# Patient Record
Sex: Male | Born: 1965
Health system: Southern US, Community
[De-identification: ages and names within clinical notes are randomized; demographics above are authoritative.]

## PROBLEM LIST (undated history)

## (undated) DIAGNOSIS — J302 Other seasonal allergic rhinitis: Secondary | ICD-10-CM

## (undated) DIAGNOSIS — L409 Psoriasis, unspecified: Secondary | ICD-10-CM

## (undated) DIAGNOSIS — E785 Hyperlipidemia, unspecified: Secondary | ICD-10-CM

## (undated) DIAGNOSIS — K219 Gastro-esophageal reflux disease without esophagitis: Secondary | ICD-10-CM

## (undated) DIAGNOSIS — Z889 Allergy status to unspecified drugs, medicaments and biological substances status: Secondary | ICD-10-CM

## (undated) DIAGNOSIS — D235 Other benign neoplasm of skin of trunk: Secondary | ICD-10-CM

## (undated) DIAGNOSIS — G4733 Obstructive sleep apnea (adult) (pediatric): Secondary | ICD-10-CM

## (undated) DIAGNOSIS — K649 Unspecified hemorrhoids: Secondary | ICD-10-CM

## (undated) DIAGNOSIS — Z9989 Dependence on other enabling machines and devices: Secondary | ICD-10-CM

## (undated) DIAGNOSIS — E291 Testicular hypofunction: Secondary | ICD-10-CM

## (undated) HISTORY — PX: VASECTOMY: SHX75

## (undated) HISTORY — DX: Hyperlipidemia, unspecified: E78.5

## (undated) HISTORY — DX: Gastro-esophageal reflux disease without esophagitis: K21.9

## (undated) HISTORY — DX: Unspecified hemorrhoids: K64.9

## (undated) HISTORY — DX: Other seasonal allergic rhinitis: J30.2

## (undated) HISTORY — PX: KNEE SURGERY: SHX244

## (undated) HISTORY — DX: Dependence on other enabling machines and devices: Z99.89

## (undated) HISTORY — DX: Allergy status to unspecified drugs, medicaments and biological substances: Z88.9

## (undated) HISTORY — DX: Testicular hypofunction: E29.1

## (undated) HISTORY — PX: NASAL SINUS SURGERY: SHX719

## (undated) HISTORY — PX: TONSILLECTOMY: SUR1361

## (undated) HISTORY — DX: Psoriasis, unspecified: L40.9

## (undated) HISTORY — DX: Other benign neoplasm of skin of trunk: D23.5

## (undated) HISTORY — DX: Obstructive sleep apnea (adult) (pediatric): G47.33

---

## 1998-10-14 ENCOUNTER — Ambulatory Visit: Admission: RE | Admit: 1998-10-14 | Discharge: 1998-10-14 | Payer: Self-pay | Admitting: Internal Medicine

## 2001-05-24 ENCOUNTER — Ambulatory Visit (HOSPITAL_BASED_OUTPATIENT_CLINIC_OR_DEPARTMENT_OTHER): Admission: RE | Admit: 2001-05-24 | Discharge: 2001-05-24 | Payer: Self-pay | Admitting: Internal Medicine

## 2002-03-25 ENCOUNTER — Ambulatory Visit (HOSPITAL_COMMUNITY): Admission: RE | Admit: 2002-03-25 | Discharge: 2002-03-25 | Payer: Self-pay | Admitting: Family Medicine

## 2002-03-25 ENCOUNTER — Encounter: Payer: Self-pay | Admitting: Family Medicine

## 2005-07-18 ENCOUNTER — Ambulatory Visit (HOSPITAL_BASED_OUTPATIENT_CLINIC_OR_DEPARTMENT_OTHER): Admission: RE | Admit: 2005-07-18 | Discharge: 2005-07-18 | Payer: Self-pay | Admitting: Otolaryngology

## 2005-07-21 ENCOUNTER — Ambulatory Visit: Payer: Self-pay | Admitting: Internal Medicine

## 2005-08-19 ENCOUNTER — Encounter: Admission: RE | Admit: 2005-08-19 | Discharge: 2005-08-19 | Payer: Self-pay | Admitting: Occupational Medicine

## 2005-08-28 ENCOUNTER — Ambulatory Visit: Admission: RE | Admit: 2005-08-28 | Discharge: 2005-08-28 | Payer: Self-pay | Admitting: Otolaryngology

## 2005-10-16 ENCOUNTER — Inpatient Hospital Stay (HOSPITAL_COMMUNITY): Admission: RE | Admit: 2005-10-16 | Discharge: 2005-10-18 | Payer: Self-pay | Admitting: Otolaryngology

## 2005-10-16 ENCOUNTER — Encounter (INDEPENDENT_AMBULATORY_CARE_PROVIDER_SITE_OTHER): Payer: Self-pay | Admitting: *Deleted

## 2008-05-30 ENCOUNTER — Encounter: Payer: Self-pay | Admitting: Internal Medicine

## 2008-06-05 ENCOUNTER — Ambulatory Visit (HOSPITAL_BASED_OUTPATIENT_CLINIC_OR_DEPARTMENT_OTHER): Admission: RE | Admit: 2008-06-05 | Discharge: 2008-06-05 | Payer: Self-pay | Admitting: Otolaryngology

## 2008-06-10 ENCOUNTER — Ambulatory Visit: Payer: Self-pay | Admitting: Internal Medicine

## 2008-08-22 ENCOUNTER — Encounter: Payer: Self-pay | Admitting: Internal Medicine

## 2009-10-02 ENCOUNTER — Encounter
Admission: RE | Admit: 2009-10-02 | Discharge: 2009-10-02 | Payer: Self-pay | Admitting: Physical Medicine and Rehabilitation

## 2010-12-04 NOTE — Procedures (Signed)
NAMEDIERKS, WACH NO.:  [id number]   MEDICAL RECORD NO.:  [id number]          PATIENT TYPE:  OUT   LOCATION:  SLEEP CENTER                 FACILITY:  [facility]   PHYSICIAN:  Clinton D. Maple Hudson, MD, FCCP, FACPDATE OF BIRTH:  02-11-1967   DATE OF STUDY:  06/05/2008                            NOCTURNAL POLYSOMNOGRAM   REFERRING PHYSICIAN:   INDICATION FOR STUDY:  Hypersomnia with sleep apnea.   EPWORTH SLEEPINESS SCORE:  15/24.   BMI 30.8.  Weight 215 pounds.  Height 70 inches.  Neck 16.5 inches.  The  patient is status post UPPP.   MEDICATIONS:  Methotrexate and folic acid.   SLEEP ARCHITECTURE:  Split-study protocol:  During the diagnostic phase,  total sleep time was 145 minutes with sleep efficiency 79%.  Stage I was  3.4%.  Stage II 84.1%.  Stage III absent.  REM 12.4% of total sleep  time.  Sleep latency 24 minutes.  REM latency 121 minutes.  Wake after  sleep onset 5 minutes.  Arousal index 5.4.  No bedtime medication was  taken.   RESPIRATORY DATA:  Split-study protocol:  Apnea-hypopnea index (AHI) 29  per hour before CPAP.  A total of 70 events were scored including all  hypopneas.  Events were primarily associated with supine sleep position.  REM AHI 10.  CPAP was titrated to 13 CWP, AHI 0 hour.  He chose a  Respironics ComfortLite 2 size medium with heated humidifier.   OXYGEN DATA:  Moderately loud snoring before CPAP with oxygen  desaturation to a nadir of 87%.  Mean oxygen saturation through the  study after CPAP was 96.2% on room air.   CARDIAC DATA:  Normal sinus rhythm.   MOVEMENT-PARASOMNIA:  No significant movement disturbance.  No bathroom  trips.   IMPRESSIONS-RECOMMENDATIONS:  1. Moderate obstructive sleep apnea/hypopnea syndrome, AHI 29 per hour      with all of that being hypopneas, predominantly while sleeping      supine.  Moderately loud snoring with oxygen desaturation to a      nadir of 87%.  2. Successful CPAP titration to  13 CWP, AHI 0 per hour.  He chose a      Respironics ComfortLite 2 nasal mask with size medium heated      humidifier.      Clinton D. Maple Hudson, MD, [facility], FACP  Diplomate, Biomedical engineer of Sleep Medicine  Electronically Signed     CDY/MEDQ  D:  06/11/2008 13:57:58  T:  06/12/2008 16:10:96  Job:  [id number]

## 2010-12-07 NOTE — Op Note (Signed)
Lance Walker, Lance Walker NO.:  [id number]   MEDICAL RECORD NO.:  [id number]          PATIENT TYPE:  OIB   LOCATION:  3304                         FACILITY:  [facility]   PHYSICIAN:  Karol T. Lazarus Salines, M.D. DATE OF BIRTH:  01/28/1966   DATE OF PROCEDURE:  10/16/2005  DATE OF DISCHARGE:                                 OPERATIVE REPORT   PREOPERATIVE DIAGNOSIS:  Obstructive sleep apnea.  Nasal septal deviation.  Hypertrophic inferior turbinates.   POSTOPERATIVE DIAGNOSIS:  Obstructive sleep apnea.  Nasal septal deviation.  Hypertrophic inferior turbinates.   PROCEDURE PERFORMED:  Septoplasty, submucous resection of inferior  turbinates, tonsillectomy, uvulopalatopharyngoplasty.   SURGEON:  Gloris Manchester. Lazarus Salines, M.D.   ANESTHESIA:  General orotracheal.   BLOOD LOSS:  Minimal.   COMPLICATIONS:  None.   FINDINGS:  An overall narrow nose with a thick septum, spurred off the  maxillary crest on both sides and mildly deviated towards the left.  Hypertrophic inferior turbinates bilateral.  A relatively narrow fleshy  oropharynx with 1+ imbedded tonsils on a long thick soft palate and uvula.  Bulky tongue.   PROCEDURE:  With the patient in the comfortable supine position, general  orotracheal anesthesia was induced without difficulty.  At an appropriate  level, the patient was placed in a slight sitting position and the table was  turned 90 degrees away from anesthesia.  A saline moistened throat pack was  placed.  Nasal vibrissae were trimmed.  Cocaine crystals, 200 mg total were  placed on cotton carriers into the anterior ethmoid and sphenopalatine  ganglion regions on both sides.  Cocaine solution, 160 mg total were applied  on 1/2  x 3 inches cotton pledgets against the septum on both sides.  1%  Xylocaine with 1:100,000 epinephrine, 8 mL total infiltrated into the  submucoperichondrial plane of the septum on both sides, into the anterior  floor of the nose on both  sides, and around the nasal spine.  Several  minutes were allowed for this take effect.  A sterile preparation and  draping of the midface was accomplished.   The materials were removed from the nose and observed to be intact and  correct in number.  The findings were as described above.  A small left  floor incision was executed and a right hemitransfixion incision was  executed, carried to the caudal edge of the quadrangular cartilage, and  continued onto the floor.  Floor tunnels were dissected on both sides and  brought up to the maxillary crest anteriorly and to the vomer posteriorly.   The submucoperichondrial plane of the right septum was elevated up to the  dorsum of the nose back onto the perpendicular plate and brought down and  communicated with the floor tunnels.  There were several small linear rents  on the right side.  The chondroethmoid junction was identified and opened  with the Cottle elevator and the opposite submucoperiosteal plane was  dissected.  The superior perpendicular plate was lysed with an open Leane Para-  Middleton forceps.  The bone was rather thick.  Upon separating the  perpendicular plate from the  vault of the nose, the inferior portion was  dissected and then rocked free and delivered.  Additional bony and  cartilaginous spicules were dissected and delivered.  At this point a 2 mm  strip of the inferior quadrangular cartilage was incised and dissected  submucosally.  Dissection was carried down to the left floor tunnel allowing  the septum to mobilize in the midline.  The posterior maxillary crest behind  the caudal strut was resected using mallet and osteotome.  The anterior  portion was narrow but not reduced in its vertical height.  The quadrangular  cartilage at this point could trapdoor directly into the midline and was  secured to the maxillary crest with a figure-of-eight 4-0 PDS suture.  Hemostasis was observed.  The septal tunnels were suctioned  clear.  The  flaps were laid back down and the wounds were closed with interrupted 4-0  chromic suture.   Just prior to completing the septoplasty, the inferior turbinates were  infiltrated with 4 mL total of 1% Xylocaine with 1:100,000 epinephrine.   Beginning on the right side, the anterior hood of the inferior turbinate was  incised just behind the nasal valve.  The medial mucosa of the turbinate was  incised in an anterior upsloping fashion.  A laterally based flap was  developed.  The turbinate was infractured.  Using angled turbinate scissors,  turbinate bone and lateral mucosa was resected in a posterior downsloping  fashion taking essentially all of the anterior pole and leaving all of the  posterior pole.  Bony spicules were removed to allow more prompt healing.  The bulbous posterior pole and the cut mucosal edges were suction coagulated  for hemostasis.  The flap was laid back down, the turbinate was  outfractured, and this side was completed.  The left side was done in  identical fashion.   After completing the septoplasty in the turbinates, 0.040 reinforced  Silastic splints were fashioned and placed against the septum on both sides  and secured thereto with a 3-0 nylon stitch.  A double thickness Neosporin  impregnated Telfa pack was placed in both sides of the nose against the  inferior turbinates.  A 6.5-mm nasal trumpet which had been shortened to  reach the nasopharynx was passed between the Silastic and the Telfa packing  on both sides to allow a modest postoperative airway.  This completed the  nasal portion of the procedure.  Hemostasis was observed.   At this point the patient was flattened and placed in Trendelenburg.  The  pharynx was suctioned clean the throat pack was removed.  Taking care to  protect lips, teeth, and endotracheal tube, the Crowe-Davis mouth gag was  introduced, expanded for visualization, and suspended from the Mayo stand in the standard  fashion.  The findings were as described above.  The tongue was  quite bulky and both tonsils could not be directly accessed at the very same  time.  The previously placed [place] ink tattoo mark was noted.   The V chevron was placed around the tattoo mark up onto the soft palate.  From the top of the V, the lateral limbs were extended beginning with the  cautery over onto the anterior tonsillar pillars.  The mouth gag was  repositioned for access to the left tonsil.  The tonsil was grasped and  retracted medially.  Using cautery, the anterior pillar was dissected down  to the capsule of the tonsil which was quite fibrotic.  The tonsil was  dissected  from its muscular fossa and the dissection was carried up into a  broad arch onto the soft palate full-thickness.  It remained pedicled by the  central soft palate.   The mouth gag was repositioned and the right tonsil was approached in the  same fashion.   After having both tonsils dissected and pedicled by the central soft palate,  the dissection was carried out taking pains to preserve some posterior  mucosa in the midline to bring forward into the chevron.   The specimen was resected en bloc with both tonsils and the soft palate.  This was sent for gross only interpretation by pathology.  Hemostasis was  observed.   The posterior mucosa of the uvula was brought forward into the chevron and  secured thereto with a 3-0 Vicryl stitch.  The mucosa was brought forward  and directed laterally across the free edge of the soft palate.  The  nasopharyngeal mucosa of the soft palate was incised in the lateral  direction to allow the posterior tonsillar pillar to be brought directly  laterally to the anterior tonsillar pillar without tethering the corner of  the soft palate.  After this incision, the tonsil fossae were closed on each  side with 3-0 Vicryl stitches and additional stitches were placed along the  free edge of the soft palate  bringing the nasopharyngeal mucosa forward.  A  good configuration of the soft palate was accomplished.  Hemostasis was  observed.  The pharynx was irrigated and suctioned clean.  The mouth gag was  relaxed for several minutes.   Upon re-expansion, hemostasis was observed in the mouth and in the throat.  At this point the procedure was completed.  The patient was returned to  anesthesia, awakened, extubated, and transferred to recovery in stable  condition.   COMMENT:  45 year old white male with moderate sleep apnea with  contributions from the narrow nose, deviated septum, hypertrophic  turbinates, and a long thick soft palate and uvula were the indications for  the several components of today's procedure.  Anticipate routine  postoperative recovery with attention to ice, elevation, analgesia,  antibiosis, hydration, and observation for bleeding, emesis, or airway  compromise.  Will observe him in the step-down unit given the combination of general anesthesia, narcotic analgesics, and his sleep apnea.      Gloris Manchester. Lazarus Salines, M.D.  Electronically Signed     KTW/MEDQ  D:  10/16/2005  T:  10/17/2005  Job:  [id number]   cc:   Holley Bouche, M.D.  Fax: [phone]   Clinton D. Maple Hudson, M.D.  [facility]  [address]  [place]  [place] [address]

## 2010-12-07 NOTE — Procedures (Signed)
NAME:  Lance Walker, Lance Walker NO.:  [id number]   MEDICAL RECORD NO.:  [id number]          PATIENT TYPE:  OUT   LOCATION:  SLEEP CENTER                 FACILITY:  [facility]   PHYSICIAN:  Clinton D. Maple Hudson, M.D. DATE OF BIRTH:  Dec 10, 1965   DATE OF STUDY:                              NOCTURNAL POLYSOMNOGRAM   REFERRING PHYSICIAN:  Dr. Flo Shanks.   DATE OF STUDY:  July 18, 2005.   INDICATION FOR STUDY:  Hypersomnia with sleep apnea.   EPWORTH SLEEPINESS SCORE:  13/24.   BMI:  30.   WEIGHT:  210 pounds.   No home medications reported.   SLEEP ARCHITECTURE:  Total sleep time 415 minutes with sleep efficiency 90%.  Stage I 5%, stage II 75%, stages III and IV 1%, REM 19% of total sleep time.  Sleep latency 17 minutes, REM latency 86 minutes, awake after sleep onset 31  minutes, arousal index 68 which is increased. No bedtime medication.   RESPIRATORY DATA:  NPSG protocol:  Apnea/hypopnea index (AHI, RDI) 19.9  obstructive events per hour indicating moderate obstructive sleep  apnea/hypopnea syndrome. There were 67 obstructive apneas and 71 hypopneas.  Events were positional, mostly reported while sleeping supine (AHI 40.4 per  hour).   OXYGEN DATA:  Moderate snoring with oxygen desaturation to a nadir of 74%.  Mean oxygen saturation through the study was 95% on room air.   CARDIAC DATA:  Sinus rhythm with sinus bradycardia 54-57 beats per minute.   MOVEMENT/PARASOMNIA:  Occasional leg jerk, insignificant.   IMPRESSION/RECOMMENDATIONS:  1.  Moderate obstructive sleep apnea/hypopnea syndrome, AHI 19.9 per hour      with positional events worst while      supine (AHI 40.4 per hour), moderate snoring and oxygen desaturation to      74%.  2.  NPSG protocol was ordered. C-PAP titration can be done if appropriate.      Clinton D. Maple Hudson, M.D.  Diplomate, Biomedical engineer of Sleep Medicine  Electronically Signed     CDY/MEDQ  D:  07/21/2005 10:52:41  T:   07/21/2005 16:49:40  Job:  [id number]

## 2011-01-15 ENCOUNTER — Other Ambulatory Visit (HOSPITAL_COMMUNITY): Payer: Self-pay | Admitting: Dermatology

## 2011-01-15 DIAGNOSIS — L408 Other psoriasis: Secondary | ICD-10-CM

## 2011-01-29 ENCOUNTER — Other Ambulatory Visit (HOSPITAL_COMMUNITY): Payer: Self-pay

## 2011-02-05 ENCOUNTER — Ambulatory Visit (HOSPITAL_COMMUNITY)
Admission: RE | Admit: 2011-02-05 | Discharge: 2011-02-05 | Disposition: A | Discharge status: Home / Self Care | Payer: 59 | Source: Ambulatory Visit | Attending: Dermatology | Admitting: Dermatology

## 2011-02-05 ENCOUNTER — Other Ambulatory Visit: Payer: Self-pay | Admitting: Interventional Radiology

## 2011-02-05 DIAGNOSIS — L408 Other psoriasis: Secondary | ICD-10-CM | POA: Insufficient documentation

## 2011-02-05 DIAGNOSIS — Z79899 Other long term (current) drug therapy: Secondary | ICD-10-CM | POA: Insufficient documentation

## 2011-02-05 LAB — CBC
HCT: 43.3 % (ref 39.0–52.0)
MCH: 31.1 pg (ref 26.0–34.0)
MCHC: 35.3 g/dL (ref 30.0–36.0)
MCV: 88 fL (ref 78.0–100.0)
RDW: 13.2 % (ref 11.5–15.5)

## 2012-04-20 ENCOUNTER — Other Ambulatory Visit: Payer: Self-pay | Admitting: Family Medicine

## 2012-04-22 ENCOUNTER — Ambulatory Visit
Admission: RE | Admit: 2012-04-22 | Discharge: 2012-04-22 | Disposition: A | Discharge status: Home / Self Care | Payer: Self-pay | Source: Ambulatory Visit | Attending: Family Medicine | Admitting: Family Medicine

## 2014-03-29 ENCOUNTER — Other Ambulatory Visit: Payer: Self-pay | Admitting: Family Medicine

## 2014-03-29 ENCOUNTER — Ambulatory Visit
Admission: RE | Admit: 2014-03-29 | Discharge: 2014-03-29 | Disposition: A | Discharge status: Home / Self Care | Payer: BC Managed Care – PPO | Source: Ambulatory Visit | Attending: Family Medicine | Admitting: Family Medicine

## 2014-03-29 DIAGNOSIS — M546 Pain in thoracic spine: Secondary | ICD-10-CM

## 2014-08-26 ENCOUNTER — Other Ambulatory Visit: Payer: Self-pay | Admitting: *Deleted

## 2014-08-26 DIAGNOSIS — R000 Tachycardia, unspecified: Secondary | ICD-10-CM

## 2014-08-29 ENCOUNTER — Encounter (INDEPENDENT_AMBULATORY_CARE_PROVIDER_SITE_OTHER): Payer: BLUE CROSS/BLUE SHIELD

## 2014-08-29 ENCOUNTER — Encounter: Payer: Self-pay | Admitting: *Deleted

## 2014-08-29 DIAGNOSIS — R000 Tachycardia, unspecified: Secondary | ICD-10-CM

## 2014-08-29 NOTE — Progress Notes (Signed)
Patient ID: Lance Walker, male   DOB: [birth year], 49 y.o.   MRN: [id number] Preventice verite 30 day cardiac event monitor applied to patient.

## 2014-09-01 ENCOUNTER — Telehealth: Payer: Self-pay | Admitting: *Deleted

## 2014-09-01 NOTE — Telephone Encounter (Signed)
Spoke with patient.  Pt confirmed that he was at the gym during the time of monitor tracing yesterday.  Pt states he is fine now.  Pt states he regularly goes to the gym M-Tu-Wed-Thur 4:30-6PM.  Reviewed with Dr Rayann Heman.

## 2014-09-01 NOTE — Telephone Encounter (Signed)
Received monitor tracing dated 08/31/14 4:26 PM (CST):  Sinus tachycardia w/Lead Loss/Artifact HR 160.2 Patient symptom: Auto Detect: Asymptomatic    Patient Activity: Exercising    Location: Gym  LMTCB for pt to confirm he was exercising at this time.

## 2014-09-01 NOTE — Telephone Encounter (Signed)
Follow Up  Pt returning Anne's call. Please call back and discuss.

## 2014-09-09 ENCOUNTER — Telehealth: Payer: Self-pay

## 2014-09-09 NOTE — Telephone Encounter (Signed)
Pt wearing heart monitor on day 11 (2/18) at 4:16pm he had event of Sinus Tachy with HR of 162.  Pt was contacted and he was on the way to the gym driving in his car.  He denise any c/o of dizziness, CP, palpations, or SOB.  Pt also stated that he is being mailed a new monitor as his is stuck on "passed out" for when he pushes the button. Pt currently denies any CP or palpitations and knows to call our office or 911 is starts to have symptoms.

## 2016-08-22 DIAGNOSIS — D235 Other benign neoplasm of skin of trunk: Secondary | ICD-10-CM

## 2016-08-22 HISTORY — PX: SHOULDER SURGERY: SHX246

## 2016-08-22 HISTORY — DX: Other benign neoplasm of skin of trunk: D23.5

## 2016-11-19 DIAGNOSIS — M25511 Pain in right shoulder: Secondary | ICD-10-CM | POA: Diagnosis not present

## 2016-11-19 DIAGNOSIS — M25512 Pain in left shoulder: Secondary | ICD-10-CM | POA: Diagnosis not present

## 2016-11-25 DIAGNOSIS — M5412 Radiculopathy, cervical region: Secondary | ICD-10-CM | POA: Diagnosis not present

## 2016-12-02 DIAGNOSIS — M150 Primary generalized (osteo)arthritis: Secondary | ICD-10-CM | POA: Diagnosis not present

## 2016-12-02 DIAGNOSIS — M25511 Pain in right shoulder: Secondary | ICD-10-CM | POA: Diagnosis not present

## 2016-12-02 DIAGNOSIS — M25512 Pain in left shoulder: Secondary | ICD-10-CM | POA: Diagnosis not present

## 2016-12-05 DIAGNOSIS — L400 Psoriasis vulgaris: Secondary | ICD-10-CM | POA: Diagnosis not present

## 2016-12-05 DIAGNOSIS — D485 Neoplasm of uncertain behavior of skin: Secondary | ICD-10-CM | POA: Diagnosis not present

## 2016-12-05 DIAGNOSIS — D225 Melanocytic nevi of trunk: Secondary | ICD-10-CM | POA: Diagnosis not present

## 2016-12-05 DIAGNOSIS — L814 Other melanin hyperpigmentation: Secondary | ICD-10-CM | POA: Diagnosis not present

## 2016-12-05 DIAGNOSIS — L718 Other rosacea: Secondary | ICD-10-CM | POA: Diagnosis not present

## 2016-12-06 DIAGNOSIS — H11002 Unspecified pterygium of left eye: Secondary | ICD-10-CM | POA: Diagnosis not present

## 2016-12-13 DIAGNOSIS — E291 Testicular hypofunction: Secondary | ICD-10-CM | POA: Diagnosis not present

## 2016-12-24 DIAGNOSIS — E291 Testicular hypofunction: Secondary | ICD-10-CM | POA: Diagnosis not present

## 2017-01-02 DIAGNOSIS — R945 Abnormal results of liver function studies: Secondary | ICD-10-CM | POA: Diagnosis not present

## 2017-02-17 DIAGNOSIS — H524 Presbyopia: Secondary | ICD-10-CM | POA: Diagnosis not present

## 2017-02-17 DIAGNOSIS — H11002 Unspecified pterygium of left eye: Secondary | ICD-10-CM | POA: Diagnosis not present

## 2017-02-17 DIAGNOSIS — Z1211 Encounter for screening for malignant neoplasm of colon: Secondary | ICD-10-CM | POA: Diagnosis not present

## 2017-02-26 DIAGNOSIS — Z1211 Encounter for screening for malignant neoplasm of colon: Secondary | ICD-10-CM | POA: Diagnosis not present

## 2017-02-26 DIAGNOSIS — D126 Benign neoplasm of colon, unspecified: Secondary | ICD-10-CM | POA: Diagnosis not present

## 2017-03-11 DIAGNOSIS — M25511 Pain in right shoulder: Secondary | ICD-10-CM | POA: Diagnosis not present

## 2017-04-03 DIAGNOSIS — G4733 Obstructive sleep apnea (adult) (pediatric): Secondary | ICD-10-CM | POA: Diagnosis not present

## 2017-04-15 DIAGNOSIS — M25511 Pain in right shoulder: Secondary | ICD-10-CM | POA: Diagnosis not present

## 2017-04-25 DIAGNOSIS — G4733 Obstructive sleep apnea (adult) (pediatric): Secondary | ICD-10-CM | POA: Diagnosis not present

## 2017-05-14 DIAGNOSIS — Z23 Encounter for immunization: Secondary | ICD-10-CM | POA: Diagnosis not present

## 2017-05-14 DIAGNOSIS — Z125 Encounter for screening for malignant neoplasm of prostate: Secondary | ICD-10-CM | POA: Diagnosis not present

## 2017-05-14 DIAGNOSIS — G4730 Sleep apnea, unspecified: Secondary | ICD-10-CM | POA: Diagnosis not present

## 2017-05-14 DIAGNOSIS — E7800 Pure hypercholesterolemia, unspecified: Secondary | ICD-10-CM | POA: Diagnosis not present

## 2017-05-14 DIAGNOSIS — Z0000 Encounter for general adult medical examination without abnormal findings: Secondary | ICD-10-CM | POA: Diagnosis not present

## 2017-05-20 ENCOUNTER — Other Ambulatory Visit: Payer: Self-pay | Admitting: Family Medicine

## 2017-05-20 DIAGNOSIS — N5089 Other specified disorders of the male genital organs: Secondary | ICD-10-CM

## 2017-05-26 ENCOUNTER — Ambulatory Visit
Admission: RE | Admit: 2017-05-26 | Discharge: 2017-05-26 | Disposition: A | Discharge status: Home / Self Care | Payer: Commercial Managed Care - PPO | Source: Ambulatory Visit | Attending: Family Medicine | Admitting: Family Medicine

## 2017-05-26 DIAGNOSIS — N5089 Other specified disorders of the male genital organs: Secondary | ICD-10-CM

## 2017-05-26 DIAGNOSIS — N433 Hydrocele, unspecified: Secondary | ICD-10-CM | POA: Diagnosis not present

## 2017-06-09 ENCOUNTER — Ambulatory Visit (INDEPENDENT_AMBULATORY_CARE_PROVIDER_SITE_OTHER): Payer: Commercial Managed Care - PPO | Admitting: Cardiology

## 2017-06-09 ENCOUNTER — Encounter: Payer: Self-pay | Admitting: Cardiology

## 2017-06-09 VITALS — BP 130/89 | HR 73 | Ht 70.0 in | Wt 216.0 lb

## 2017-06-09 DIAGNOSIS — R030 Elevated blood-pressure reading, without diagnosis of hypertension: Secondary | ICD-10-CM | POA: Diagnosis not present

## 2017-06-09 DIAGNOSIS — E669 Obesity, unspecified: Secondary | ICD-10-CM | POA: Diagnosis not present

## 2017-06-09 DIAGNOSIS — E785 Hyperlipidemia, unspecified: Secondary | ICD-10-CM | POA: Insufficient documentation

## 2017-06-09 DIAGNOSIS — Z9189 Other specified personal risk factors, not elsewhere classified: Secondary | ICD-10-CM

## 2017-06-09 NOTE — Progress Notes (Signed)
PCP: Shirline Frees, MD  Clinic Note: Chief Complaint  Patient presents with  . New Patient (Initial Visit)    was told to come here for a baseline now that he is 51     HPI: Lance Walker is a 51 y.o. male who is being seen today for Baseline CV Risk Evaluation of at the request of Shirline Frees, MD.  [name] was seen by his PCP on May 14, 2017 for routine follow-up and requested based on cardiovascular evaluation.  Apparently he had a event monitor back in March 2016 -he cannot really remember what it was for. He has OSA for which she uses CPAP.  He also is concerned about possibly having narcolepsy.  He says it does not really help his daytime sleepiness.  Recent Hospitalizations: None  Studies Personally Reviewed - (if available, images/films reviewed: From Epic Chart or Care Everywhere)  None  Interval History: Yuepheng is a otherwise healthy gentleman with cholesterolemia and CPAP with mild obesity and borderline hypertension who presents here today to establish a cardiology baseline.  He is relatively active, and does CrossFit training for about 45 minutes to an hour at least once a week if not twice.  Depends on the timing.  He is a former smoker who quit in 1995.  He notes that because of his grandfather's history of heart disease he wanted to be evaluated. With his CrossFit training, he denies chest tightness or pressure with rest or exertion.  No heart failure symptoms of PND, orthopnea or edema.  No rapid irregular heartbeats or palpitations.  No syncope/near syncope or TIA/amaurosis fugax symptoms.   No claudication  ROS: A comprehensive was performed. Review of Systems  Constitutional: Positive for malaise/fatigue.  HENT: Negative for congestion and nosebleeds.   Respiratory: Positive for cough (Sometimes in the morning with allergies).   Cardiovascular: Negative.   Gastrointestinal: Negative for blood in stool, constipation and melena.    Genitourinary: Negative for hematuria.  Musculoskeletal: Negative for joint pain (Still has some issues with his right shoulder after his injury.).  Neurological: Negative for dizziness and focal weakness.       Significant daytime sleepiness fatigue.  This is despite using CPAP.  Endo/Heme/Allergies: Negative for environmental allergies.  Psychiatric/Behavioral: Negative for memory loss. The patient has insomnia. The patient is not nervous/anxious.   All other systems reviewed and are negative.  I have reviewed and (if needed) personally updated the patient's problem list, medications, allergies, past medical and surgical history, social and family history.   Past Medical History:  Diagnosis Date  . Diet-controlled hyperlipidemia   . Dysplastic nevus of trunk 08/2016   Mid back  . GERD (gastroesophageal reflux disease)   . H/O seasonal allergies   . Hemorrhoids    With related constipation  . Hypogonadism in male   . OSA on CPAP    Concerns for possible narcolepsy  . Psoriasis    Followed by Dr. Ronnald Ramp    Past Surgical History:  Procedure Laterality Date  . KNEE SURGERY    . NASAL SINUS SURGERY     Uvulopalatopharyngoplasty  . SHOULDER SURGERY Left 08/2016  . TONSILLECTOMY    . VASECTOMY      Current Meds  Medication Sig  . ASPIRIN 81 PO Take 81 mg daily by mouth.  . cetirizine (ZYRTEC) 10 MG tablet Take 10 mg daily by mouth.  . clomiPHENE (CLOMID) 50 MG tablet Take 0.5 tablets daily by mouth.    No Known  Allergies  Social History   Socioeconomic History  . Marital status: Married    Spouse name: None  . Number of children: None  . Years of education: 54  . Highest education level: Some college, no degree  Social Needs  . Financial resource strain: None  . Food insecurity - worry: None  . Food insecurity - inability: None  . Transportation needs - medical: None  . Transportation needs - non-medical: None  Occupational History  . Occupation: Programme researcher, broadcasting/film/video: [employer]  Tobacco Use  . Smoking status: Former Smoker    Types: Cigarettes    Last attempt to quit: 1995    Years since quitting: 23.9  . Smokeless tobacco: Never Used  . Tobacco comment: quit 1995  Substance and Sexual Activity  . Alcohol use: Yes    Alcohol/week: 2.4 oz    Types: 4 Cans of beer per week    Comment: 3-5 drinks of beer or liquor  . Drug use: No  . Sexual activity: None  Other Topics Concern  . None  Social History Narrative   Married father of 1 daughter.  He lives with his wife and daughter.      family history includes Alcohol abuse in his father; Breast cancer in his mother; Heart disease in his maternal grandfather; Hypertension in his maternal grandfather.  Wt Readings from Last 3 Encounters:  06/09/17 216 lb (98 kg)    PHYSICAL EXAM BP 130/89 (BP Location: Left Arm)   Pulse 73   Ht 5\' 10"  (1.778 m)   Wt 216 lb (98 kg)   BMI 30.99 kg/m  Physical Exam  Constitutional: He is oriented to person, place, and time. He appears well-developed and well-nourished. No distress.  Borderline obese  HENT:  Head: Normocephalic and atraumatic.  Eyes: Conjunctivae and EOM are normal. Pupils are equal, round, and reactive to light.  Neck: Normal range of motion. Neck supple. No hepatojugular reflux and no JVD present. Carotid bruit is not present. No thyromegaly present.  Cardiovascular: Normal rate, regular rhythm and intact distal pulses.  No extrasystoles are present. PMI is not displaced. Exam reveals no gallop and no friction rub.  No murmur heard. Nondisplaced   Pulmonary/Chest: Effort normal and breath sounds normal. No respiratory distress. He has no wheezes. He has no rales.  Abdominal: Soft. Bowel sounds are normal. He exhibits no distension. There is no tenderness. There is no rebound.  Musculoskeletal: Normal range of motion. He exhibits no edema or deformity.  Neurological: He is alert and oriented to person, place, and  time. No cranial nerve deficit.  Skin: Skin is warm and dry. No rash noted. No erythema.  Psychiatric: He has a normal mood and affect. His behavior is normal. Judgment and thought content normal.  Nursing note and vitals reviewed.    Adult ECG Report  Rate: 64 ;  Rhythm: normal sinus rhythm and Normal axis, intervals and durations; normal voltage  Narrative Interpretation: Normal EKG  Other studies Reviewed: Additional studies/ records that were reviewed today include:  Recent Labs: 05/14/2017  Na+ 143, K+ 4.4, Cl- 102, HCO3- 31, BUN 21, Cr 1.3, Glu 79, Ca2+ 10.2; AST 70, ALT 103, AlkP 61,  T Bili 0.9, TP 7.3,Alb 4.8  CBC: W 5.1, H/H 16/49, Plt 203  TC 244, TG 144, HDL 44, LDL 172    ASSESSMENT / PLAN: Problem List Items Addressed This Visit    Borderline hypertension - Primary    Blood pressure  looks borderline today with a diastolic pressure of 89.  Will need to follow.      Relevant Orders   EKG 12-Lead (Completed)   CT CARDIAC SCORING   Cardiovascular risk factor    51 year old gentleman with mild obesity, hyperlipidemia and borderline blood pressures is borderline for metabolic syndrome. For requests we will check her cardiovascular risk.  I think a stress test in this gentleman who does CrossFit training is probably not going to be helpful as it would likely be negative.  I think the best option will be to exclude existing coronary disease with a coronary calcium score.  Plan: Coronary calcium score      Hyperlipidemia with target LDL less than 130 (Chronic)    Levels are somewhat elevated..  We can get some baseline cardiac risk evaluation to determine if we need to be more aggressive as his current LDL is 170.  Plan: Check Coronary Calcium Score CT for risk stratification.      Relevant Medications   ASPIRIN 81 PO   Other Relevant Orders   EKG 12-Lead (Completed)   CT CARDIAC SCORING   Obesity (BMI 30.0-34.9)    He does exercise, but needs to be more  regular. The patient understands the need to lose weight with diet and exercise. We have discussed specific strategies for this.      Relevant Orders   EKG 12-Lead (Completed)   CT CARDIAC SCORING      Current medicines are reviewed at length with the patient today. (+/- concerns) none The following changes have been made:None  Patient Instructions  Your physician recommends that you schedule a follow-up appointment in [place].    Dr Ellyn Hack has ordered a CT coronary calcium score. This test is done at [address]. THERE IS AN $150 CHARGE THAIS NOT COVERED BY INSURANCE   Coronary CalciumScan A coronary calcium scan is an imaging test used to look for deposits of calcium and other fatty materials (plaques) in the inner lining of the blood vessels of the heart (coronary arteries). These deposits of calcium and plaques can partly clog and narrow the coronary arteries without producing any symptoms or warning signs. This puts a person at risk for a heart attack. This test can detect these deposits before symptoms develop. Tell a health care provider about:  Any allergies you have.  All medicines you are taking, including vitamins, herbs, eye drops, creams, and over-the-counter medicines.  Any problems you or family members have had with anesthetic medicines.  Any blood disorders you have.  Any surgeries you have had.  Any medical conditions you have.  Whether you are pregnant or may be pregnant. What are the risks? Generally, this is a safe procedure. However, problems may occur, including:  Harm to a pregnant woman and her unborn baby. This test involves the use of radiation. Radiation exposure can be dangerous to a pregnant woman and her unborn baby. If you are pregnant, you generally should not have this procedure done.  Slight increase in the risk of cancer. This is because of the radiation involved in the test. What happens before  the procedure? No preparation is needed for this procedure. What happens during the procedure?  You will undress and remove any jewelry around your neck or chest.  You will put on a hospital gown.  Sticky electrodes will be placed on your chest. The electrodes will be connected to an electrocardiogram (ECG) machine to record a  tracing of the electrical activity of your heart.  A CT scanner will take pictures of your heart. During this time, you will be asked to lie still and hold your breath for 2-3 seconds while a picture of your heart is being taken. The procedure may vary among health care providers and hospitals. What happens after the procedure?  You can get dressed.  You can return to your normal activities.  It is up to you to get the results of your test. Ask your health care provider, or the department that is doing the test, when your results will be ready. Summary  A coronary calcium scan is an imaging test used to look for deposits of calcium and other fatty materials (plaques) in the inner lining of the blood vessels of the heart (coronary arteries).  Generally, this is a safe procedure. Tell your health care provider if you are pregnant or may be pregnant.  No preparation is needed for this procedure.  A CT scanner will take pictures of your heart.  You can return to your normal activities after the scan is done. This information is not intended to replace advice given to you by your health care provider. Make sure you discuss any questions you have with your health care provider. Document Released: 01/04/2008 Document Revised: 05/27/2016 Document Reviewed: 05/27/2016 Elsevier Interactive Patient Education  2017 [employer].    Studies Ordered:   Orders Placed This Encounter  Procedures  . CT CARDIAC SCORING  . EKG 12-Lead      Glenetta Hew, M.D., M.S. Interventional Cardiologist   Pager # [phone] Phone # [phone] [address]. [place], [place] [address]

## 2017-06-09 NOTE — Patient Instructions (Addendum)
Your physician recommends that you schedule a follow-up appointment in [place].    Dr Ellyn Hack has ordered a CT coronary calcium score. This test is done at [address]. THERE IS AN $150 CHARGE THAIS NOT COVERED BY INSURANCE   Coronary CalciumScan A coronary calcium scan is an imaging test used to look for deposits of calcium and other fatty materials (plaques) in the inner lining of the blood vessels of the heart (coronary arteries). These deposits of calcium and plaques can partly clog and narrow the coronary arteries without producing any symptoms or warning signs. This puts a person at risk for a heart attack. This test can detect these deposits before symptoms develop. Tell a health care provider about:  Any allergies you have.  All medicines you are taking, including vitamins, herbs, eye drops, creams, and over-the-counter medicines.  Any problems you or family members have had with anesthetic medicines.  Any blood disorders you have.  Any surgeries you have had.  Any medical conditions you have.  Whether you are pregnant or may be pregnant. What are the risks? Generally, this is a safe procedure. However, problems may occur, including:  Harm to a pregnant woman and her unborn baby. This test involves the use of radiation. Radiation exposure can be dangerous to a pregnant woman and her unborn baby. If you are pregnant, you generally should not have this procedure done.  Slight increase in the risk of cancer. This is because of the radiation involved in the test. What happens before the procedure? No preparation is needed for this procedure. What happens during the procedure?  You will undress and remove any jewelry around your neck or chest.  You will put on a hospital gown.  Sticky electrodes will be placed on your chest. The electrodes will be connected to an electrocardiogram (ECG) machine to record a tracing of the electrical  activity of your heart.  A CT scanner will take pictures of your heart. During this time, you will be asked to lie still and hold your breath for 2-3 seconds while a picture of your heart is being taken. The procedure may vary among health care providers and hospitals. What happens after the procedure?  You can get dressed.  You can return to your normal activities.  It is up to you to get the results of your test. Ask your health care provider, or the department that is doing the test, when your results will be ready. Summary  A coronary calcium scan is an imaging test used to look for deposits of calcium and other fatty materials (plaques) in the inner lining of the blood vessels of the heart (coronary arteries).  Generally, this is a safe procedure. Tell your health care provider if you are pregnant or may be pregnant.  No preparation is needed for this procedure.  A CT scanner will take pictures of your heart.  You can return to your normal activities after the scan is done. This information is not intended to replace advice given to you by your health care provider. Make sure you discuss any questions you have with your health care provider. Document Released: 01/04/2008 Document Revised: 05/27/2016 Document Reviewed: 05/27/2016 Elsevier Interactive Patient Education  2017 [employer].

## 2017-06-12 ENCOUNTER — Encounter: Payer: Self-pay | Admitting: Cardiology

## 2017-06-12 DIAGNOSIS — R030 Elevated blood-pressure reading, without diagnosis of hypertension: Secondary | ICD-10-CM | POA: Insufficient documentation

## 2017-06-12 DIAGNOSIS — Z9189 Other specified personal risk factors, not elsewhere classified: Secondary | ICD-10-CM | POA: Insufficient documentation

## 2017-06-12 DIAGNOSIS — E669 Obesity, unspecified: Secondary | ICD-10-CM | POA: Insufficient documentation

## 2017-06-12 NOTE — Assessment & Plan Note (Addendum)
51 year old gentleman with mild obesity, hyperlipidemia and borderline blood pressures is borderline for metabolic syndrome. For requests we will check her cardiovascular risk.  I think a stress test in this gentleman who does CrossFit training is probably not going to be helpful as it would likely be negative.  I think the best option will be to exclude existing coronary disease with a coronary calcium score.  Plan: Coronary calcium score

## 2017-06-12 NOTE — Assessment & Plan Note (Signed)
Levels are somewhat elevated..  We can get some baseline cardiac risk evaluation to determine if we need to be more aggressive as his current LDL is 170.  Plan: Check Coronary Calcium Score CT for risk stratification.

## 2017-06-12 NOTE — Assessment & Plan Note (Signed)
He does exercise, but needs to be more regular. The patient understands the need to lose weight with diet and exercise. We have discussed specific strategies for this.

## 2017-06-12 NOTE — Assessment & Plan Note (Signed)
Blood pressure looks borderline today with a diastolic pressure of 89.  Will need to follow.

## 2017-06-14 DIAGNOSIS — M7541 Impingement syndrome of right shoulder: Secondary | ICD-10-CM | POA: Diagnosis not present

## 2017-06-19 ENCOUNTER — Inpatient Hospital Stay: Admission: RE | Admit: 2017-06-19 | Payer: Commercial Managed Care - PPO | Source: Ambulatory Visit

## 2017-06-19 ENCOUNTER — Ambulatory Visit (INDEPENDENT_AMBULATORY_CARE_PROVIDER_SITE_OTHER)
Admission: RE | Admit: 2017-06-19 | Discharge: 2017-06-19 | Disposition: A | Discharge status: Home / Self Care | Payer: Self-pay | Source: Ambulatory Visit | Attending: Cardiology | Admitting: Cardiology

## 2017-06-19 DIAGNOSIS — E785 Hyperlipidemia, unspecified: Secondary | ICD-10-CM

## 2017-06-19 DIAGNOSIS — E669 Obesity, unspecified: Secondary | ICD-10-CM

## 2017-06-19 DIAGNOSIS — R030 Elevated blood-pressure reading, without diagnosis of hypertension: Secondary | ICD-10-CM

## 2017-06-20 ENCOUNTER — Telehealth: Payer: Self-pay | Admitting: *Deleted

## 2017-06-20 NOTE — Telephone Encounter (Signed)
LEFT DETAILED RESULT OF CARDIAC SCORING PER DPI . ANY QUESTION MAY CALL BACK  APPOINTMENT  SCHEDULE 07/08/17

## 2017-06-20 NOTE — Telephone Encounter (Signed)
-----   Message from Leonie Man, MD sent at 06/20/2017 11:44 AM EST ----- Doristine Devoid news.  Coronary calcium score was 0.  This is essentially a very low risk finding. This suggest very low likelihood of having any significant coronary artery disease.  Grade baseline risk.   Glenetta Hew, MD

## 2017-06-24 DIAGNOSIS — E291 Testicular hypofunction: Secondary | ICD-10-CM | POA: Diagnosis not present

## 2017-06-24 DIAGNOSIS — S46011D Strain of muscle(s) and tendon(s) of the rotator cuff of right shoulder, subsequent encounter: Secondary | ICD-10-CM | POA: Diagnosis not present

## 2017-07-04 DIAGNOSIS — E291 Testicular hypofunction: Secondary | ICD-10-CM | POA: Diagnosis not present

## 2017-07-04 DIAGNOSIS — N4342 Spermatocele of epididymis, multiple: Secondary | ICD-10-CM | POA: Diagnosis not present

## 2017-07-08 ENCOUNTER — Ambulatory Visit: Payer: Commercial Managed Care - PPO | Admitting: Cardiology

## 2017-07-08 DIAGNOSIS — G8918 Other acute postprocedural pain: Secondary | ICD-10-CM | POA: Diagnosis not present

## 2017-07-08 DIAGNOSIS — M25511 Pain in right shoulder: Secondary | ICD-10-CM | POA: Diagnosis not present

## 2017-07-08 DIAGNOSIS — S46011A Strain of muscle(s) and tendon(s) of the rotator cuff of right shoulder, initial encounter: Secondary | ICD-10-CM | POA: Diagnosis not present

## 2017-07-08 DIAGNOSIS — Z7982 Long term (current) use of aspirin: Secondary | ICD-10-CM | POA: Diagnosis not present

## 2017-07-08 DIAGNOSIS — S43431A Superior glenoid labrum lesion of right shoulder, initial encounter: Secondary | ICD-10-CM | POA: Diagnosis not present

## 2017-07-08 DIAGNOSIS — S46011D Strain of muscle(s) and tendon(s) of the rotator cuff of right shoulder, subsequent encounter: Secondary | ICD-10-CM | POA: Diagnosis not present

## 2017-08-06 DIAGNOSIS — M6281 Muscle weakness (generalized): Secondary | ICD-10-CM | POA: Diagnosis not present

## 2017-08-06 DIAGNOSIS — M25511 Pain in right shoulder: Secondary | ICD-10-CM | POA: Diagnosis not present

## 2017-08-18 DIAGNOSIS — R609 Edema, unspecified: Secondary | ICD-10-CM | POA: Diagnosis not present

## 2017-08-18 DIAGNOSIS — M25511 Pain in right shoulder: Secondary | ICD-10-CM | POA: Diagnosis not present

## 2017-08-18 DIAGNOSIS — M6281 Muscle weakness (generalized): Secondary | ICD-10-CM | POA: Diagnosis not present

## 2017-08-20 DIAGNOSIS — M25511 Pain in right shoulder: Secondary | ICD-10-CM | POA: Diagnosis not present

## 2017-08-20 DIAGNOSIS — M6281 Muscle weakness (generalized): Secondary | ICD-10-CM | POA: Diagnosis not present

## 2017-08-22 ENCOUNTER — Ambulatory Visit (INDEPENDENT_AMBULATORY_CARE_PROVIDER_SITE_OTHER): Payer: Commercial Managed Care - PPO | Admitting: Cardiology

## 2017-08-22 ENCOUNTER — Encounter (INDEPENDENT_AMBULATORY_CARE_PROVIDER_SITE_OTHER): Payer: Self-pay

## 2017-08-22 ENCOUNTER — Encounter: Payer: Self-pay | Admitting: Cardiology

## 2017-08-22 VITALS — BP 110/74 | HR 68 | Ht 70.0 in | Wt 219.4 lb

## 2017-08-22 DIAGNOSIS — E785 Hyperlipidemia, unspecified: Secondary | ICD-10-CM

## 2017-08-22 DIAGNOSIS — R030 Elevated blood-pressure reading, without diagnosis of hypertension: Secondary | ICD-10-CM | POA: Diagnosis not present

## 2017-08-22 DIAGNOSIS — Z9189 Other specified personal risk factors, not elsewhere classified: Secondary | ICD-10-CM

## 2017-08-22 DIAGNOSIS — E669 Obesity, unspecified: Secondary | ICD-10-CM | POA: Diagnosis not present

## 2017-08-22 NOTE — Patient Instructions (Signed)
No change with medications      Your physician wants you to follow-up in Jan 2021 with DR HARDING. You will receive a reminder letter in the mail two months in advance. If you don't receive a letter, please call our office to schedule the follow-up appointment.

## 2017-08-22 NOTE — Progress Notes (Signed)
PCP: Shirline Frees, MD  Clinic Note: Chief Complaint  Patient presents with  . 1 month follow up    post shoulder surgery , no chest [pain , no sob , no swelling    HPI: Lance Walker is a 52 y.o. male who is being seen today for Baseline CV Risk Evaluation of at the request of Shirline Frees, MD. He seen by his PCP on May 14, 2017 for routine follow-up and requested based on cardiovascular evaluation.  Apparently he had a event monitor back in March 2016 -he cannot really remember what it was for. He has OSA for which she uses CPAP.    Lance Walker was seen for initial consult back in November to establish cardiology care.  He is relatively active, and does CrossFit training for about 45 minutes to an hour at least once a week if not twice.  Depends on the timing.  He is a former smoker who quit in 1995.  He notes that because of his grandfather's history of heart disease he wanted to be evaluated.  He had no cardiac symptoms of angina, heart failure or arrhythmias. -He had a coronary calcium score done.    Recent Hospitalizations: None  Studies Personally Reviewed - (if available, images/films reviewed: From Epic Chart or Care Everywhere)  Coronary calcium score: 0  Interval History: Lance Walker returns today a little bit frustrated because he is gained about 10 pounds over the holidays.  He underwent shoulder surgery back in December, and has not been able to exercise.  He also acknowledges that he probably overindulged over the holidays.  Otherwise he still is asymptomatic from a cardiac standpoint.  No chest pain or pressure/dyspnea with rest or exertion. Remainder of cardiac review of symptoms: No PND, orthopnea or edema.  No palpitations, lightheadedness, dizziness, weakness or syncope/near syncope. No TIA/amaurosis fugax symptoms. No claudication.   ROS: A comprehensive was performed. Review of Systems  Constitutional: Negative for malaise/fatigue.  HENT: Negative  for congestion and nosebleeds.   Respiratory: Negative for cough (Sometimes in the morning with allergies).   Cardiovascular: Negative.   Gastrointestinal: Negative for blood in stool, constipation and melena.  Genitourinary: Negative for hematuria.  Musculoskeletal: Positive for joint pain (doing better after shoulder Sgx - now released for exercise.).  Neurological: Negative for dizziness.       Significant daytime sleepiness fatigue.  This is despite using CPAP.  Endo/Heme/Allergies: Negative for environmental allergies.   I have reviewed and (if needed) personally updated the patient's problem list, medications, allergies, past medical and surgical history, social and family history.   Past Medical History:  Diagnosis Date  . Diet-controlled hyperlipidemia   . Dysplastic nevus of trunk 08/2016   Mid back  . GERD (gastroesophageal reflux disease)   . H/O seasonal allergies   . Hemorrhoids    With related constipation  . Hypogonadism in male   . OSA on CPAP    Concerns for possible narcolepsy  . Psoriasis    Followed by Dr. Ronnald Ramp    Past Surgical History:  Procedure Laterality Date  . KNEE SURGERY    . NASAL SINUS SURGERY     Uvulopalatopharyngoplasty  . SHOULDER SURGERY Left 08/2016  . TONSILLECTOMY    . VASECTOMY      Current Meds  Medication Sig  . ASPIRIN 81 PO Take 81 mg daily by mouth.  . cetirizine (ZYRTEC) 10 MG tablet Take 10 mg daily by mouth.  . clomiPHENE (CLOMID) 50 MG tablet  Take 0.5 tablets daily by mouth.    No Known Allergies  Social History   Socioeconomic History  . Marital status: Married    Spouse name: None  . Number of children: None  . Years of education: 57  . Highest education level: Some college, no degree  Social Needs  . Financial resource strain: None  . Food insecurity - worry: None  . Food insecurity - inability: None  . Transportation needs - medical: None  . Transportation needs - non-medical: None  Occupational History    . Occupation: Best boy: [employer]  Tobacco Use  . Smoking status: Former Smoker    Types: Cigarettes    Last attempt to quit: 1995    Years since quitting: 24.1  . Smokeless tobacco: Never Used  . Tobacco comment: quit 1995  Substance and Sexual Activity  . Alcohol use: Yes    Alcohol/week: 2.4 oz    Types: 4 Cans of beer per week    Comment: 3-5 drinks of beer or liquor  . Drug use: No  . Sexual activity: None  Other Topics Concern  . None  Social History Narrative   Married father of 1 daughter.  He lives with his wife and daughter.      family history includes Alcohol abuse in his father; Breast cancer in his mother; Heart disease in his maternal grandfather; Hypertension in his maternal grandfather.  Wt Readings from Last 3 Encounters:  08/22/17 219 lb 6.4 oz (99.5 kg)  06/09/17 216 lb (98 kg)    PHYSICAL EXAM BP 110/74 (BP Location: Left Arm)   Pulse 68   Ht 5\' 10"  (1.778 m)   Wt 219 lb 6.4 oz (99.5 kg)   BMI 31.48 kg/m  Physical Exam  Constitutional: He is oriented to person, place, and time. He appears well-developed and well-nourished. No distress.  Borderline obese  HENT:  Head: Normocephalic and atraumatic.  Eyes: EOM are normal.  Neck: No hepatojugular reflux present. Carotid bruit is not present.  Cardiovascular: Normal rate, regular rhythm and intact distal pulses.  No extrasystoles are present. PMI is not displaced. Exam reveals no gallop and no friction rub.  No murmur heard. Pulmonary/Chest: Effort normal and breath sounds normal. No respiratory distress. He has no wheezes. He has no rales.  Abdominal: Soft. Bowel sounds are normal.  Musculoskeletal: Normal range of motion. He exhibits no edema or deformity.  Neurological: He is alert and oriented to person, place, and time.  Psychiatric: He has a normal mood and affect. His behavior is normal. Judgment and thought content normal.  Nursing note and vitals reviewed.     Adult ECG Report n/a  Other studies Reviewed: Additional studies/ records that were reviewed today include:  Recent Labs: 05/14/2017  TC 244, TG 144, HDL 44, LDL 172**  Is is no vertigo wound, keep and keep in the follow-up for ASSESSMENT / PLAN:  Lance Walker returns today doing very well.  Unfortunately has gained weight because lack of activity over the holidays following his surgery.  His blood pressure is well controlled.  He is happy to hear the results of his coronary calcium score being quite low risk.  His blood pressure is well controlled.  He is now working on getting back into exercising and starting to lose weight again.  Major risk factor for him as the cholesterol levels.  He indicates that there is been concern of elevated LFTs and creatinine.  He and his PCP been  monitoring his lipids, and at present decided not to treat.  I did discuss with him some potential over-the-counter options including co-Q10, fish oil and red yeast rice. However, I do not suspect that these will get his LDL down below a target of 130, closer to 100.  He will likely require some to the medical management, and if he is reluctant to use statins, Zetia would be a good first option.  Problem List Items Addressed This Visit    Borderline hypertension (Chronic)   Cardiovascular risk factor   Hyperlipidemia with target LDL less than 130 - Primary (Chronic)   Obesity (BMI 30.0-34.9) (Chronic)      Current medicines are reviewed at length with the patient today. (+/- concerns) none The following changes have been made:None  Patient Instructions  No change with medications      Your physician wants you to follow-up in Jan 2021 with DR HARDING. You will receive a reminder letter in the mail two months in advance. If you don't receive a letter, please call our office to schedule the follow-up appointment.    Studies Ordered:   No orders of the defined types were placed in this  encounter.     Glenetta Hew, M.D., M.S. Interventional Cardiologist   Pager # [phone] Phone # [phone] [address] [place], [place] [address]

## 2017-09-05 DIAGNOSIS — M6281 Muscle weakness (generalized): Secondary | ICD-10-CM | POA: Diagnosis not present

## 2017-09-05 DIAGNOSIS — M25511 Pain in right shoulder: Secondary | ICD-10-CM | POA: Diagnosis not present

## 2017-09-05 DIAGNOSIS — R609 Edema, unspecified: Secondary | ICD-10-CM | POA: Diagnosis not present

## 2017-09-17 DIAGNOSIS — M25511 Pain in right shoulder: Secondary | ICD-10-CM | POA: Diagnosis not present

## 2017-09-17 DIAGNOSIS — R609 Edema, unspecified: Secondary | ICD-10-CM | POA: Diagnosis not present

## 2017-09-17 DIAGNOSIS — M6281 Muscle weakness (generalized): Secondary | ICD-10-CM | POA: Diagnosis not present

## 2017-09-19 DIAGNOSIS — R609 Edema, unspecified: Secondary | ICD-10-CM | POA: Diagnosis not present

## 2017-09-19 DIAGNOSIS — M25511 Pain in right shoulder: Secondary | ICD-10-CM | POA: Diagnosis not present

## 2017-09-19 DIAGNOSIS — M6281 Muscle weakness (generalized): Secondary | ICD-10-CM | POA: Diagnosis not present

## 2017-09-22 DIAGNOSIS — M6281 Muscle weakness (generalized): Secondary | ICD-10-CM | POA: Diagnosis not present

## 2017-09-22 DIAGNOSIS — M25511 Pain in right shoulder: Secondary | ICD-10-CM | POA: Diagnosis not present

## 2017-09-25 DIAGNOSIS — R609 Edema, unspecified: Secondary | ICD-10-CM | POA: Diagnosis not present

## 2017-09-25 DIAGNOSIS — M25511 Pain in right shoulder: Secondary | ICD-10-CM | POA: Diagnosis not present

## 2017-09-25 DIAGNOSIS — M6281 Muscle weakness (generalized): Secondary | ICD-10-CM | POA: Diagnosis not present

## 2017-09-29 DIAGNOSIS — R945 Abnormal results of liver function studies: Secondary | ICD-10-CM | POA: Diagnosis not present

## 2017-10-03 DIAGNOSIS — M25511 Pain in right shoulder: Secondary | ICD-10-CM | POA: Diagnosis not present

## 2017-10-03 DIAGNOSIS — M6281 Muscle weakness (generalized): Secondary | ICD-10-CM | POA: Diagnosis not present

## 2017-10-07 DIAGNOSIS — M25511 Pain in right shoulder: Secondary | ICD-10-CM | POA: Diagnosis not present

## 2017-10-07 DIAGNOSIS — M6281 Muscle weakness (generalized): Secondary | ICD-10-CM | POA: Diagnosis not present

## 2017-10-09 DIAGNOSIS — M6281 Muscle weakness (generalized): Secondary | ICD-10-CM | POA: Diagnosis not present

## 2017-10-09 DIAGNOSIS — M25511 Pain in right shoulder: Secondary | ICD-10-CM | POA: Diagnosis not present

## 2017-10-09 DIAGNOSIS — R609 Edema, unspecified: Secondary | ICD-10-CM | POA: Diagnosis not present

## 2017-10-13 DIAGNOSIS — M25511 Pain in right shoulder: Secondary | ICD-10-CM | POA: Diagnosis not present

## 2017-10-13 DIAGNOSIS — R609 Edema, unspecified: Secondary | ICD-10-CM | POA: Diagnosis not present

## 2017-10-13 DIAGNOSIS — M6281 Muscle weakness (generalized): Secondary | ICD-10-CM | POA: Diagnosis not present

## 2017-10-20 DIAGNOSIS — R609 Edema, unspecified: Secondary | ICD-10-CM | POA: Diagnosis not present

## 2017-10-20 DIAGNOSIS — M25511 Pain in right shoulder: Secondary | ICD-10-CM | POA: Diagnosis not present

## 2017-10-20 DIAGNOSIS — M6281 Muscle weakness (generalized): Secondary | ICD-10-CM | POA: Diagnosis not present

## 2017-10-24 DIAGNOSIS — M25511 Pain in right shoulder: Secondary | ICD-10-CM | POA: Diagnosis not present

## 2017-10-24 DIAGNOSIS — R609 Edema, unspecified: Secondary | ICD-10-CM | POA: Diagnosis not present

## 2017-10-24 DIAGNOSIS — M6281 Muscle weakness (generalized): Secondary | ICD-10-CM | POA: Diagnosis not present

## 2017-10-27 DIAGNOSIS — M6281 Muscle weakness (generalized): Secondary | ICD-10-CM | POA: Diagnosis not present

## 2017-10-27 DIAGNOSIS — R609 Edema, unspecified: Secondary | ICD-10-CM | POA: Diagnosis not present

## 2017-10-27 DIAGNOSIS — M25511 Pain in right shoulder: Secondary | ICD-10-CM | POA: Diagnosis not present

## 2017-10-31 DIAGNOSIS — M6281 Muscle weakness (generalized): Secondary | ICD-10-CM | POA: Diagnosis not present

## 2017-10-31 DIAGNOSIS — M25511 Pain in right shoulder: Secondary | ICD-10-CM | POA: Diagnosis not present

## 2017-10-31 DIAGNOSIS — R609 Edema, unspecified: Secondary | ICD-10-CM | POA: Diagnosis not present

## 2017-11-03 DIAGNOSIS — M6281 Muscle weakness (generalized): Secondary | ICD-10-CM | POA: Diagnosis not present

## 2017-11-03 DIAGNOSIS — M25511 Pain in right shoulder: Secondary | ICD-10-CM | POA: Diagnosis not present

## 2017-11-04 DIAGNOSIS — M6281 Muscle weakness (generalized): Secondary | ICD-10-CM | POA: Diagnosis not present

## 2017-11-07 DIAGNOSIS — M6281 Muscle weakness (generalized): Secondary | ICD-10-CM | POA: Diagnosis not present

## 2017-11-07 DIAGNOSIS — M25511 Pain in right shoulder: Secondary | ICD-10-CM | POA: Diagnosis not present

## 2017-11-13 DIAGNOSIS — M25511 Pain in right shoulder: Secondary | ICD-10-CM | POA: Diagnosis not present

## 2017-11-13 DIAGNOSIS — M6281 Muscle weakness (generalized): Secondary | ICD-10-CM | POA: Diagnosis not present

## 2017-11-19 DIAGNOSIS — M25511 Pain in right shoulder: Secondary | ICD-10-CM | POA: Diagnosis not present

## 2017-11-19 DIAGNOSIS — M6281 Muscle weakness (generalized): Secondary | ICD-10-CM | POA: Diagnosis not present

## 2017-11-19 DIAGNOSIS — R600 Localized edema: Secondary | ICD-10-CM | POA: Diagnosis not present

## 2017-11-24 DIAGNOSIS — L400 Psoriasis vulgaris: Secondary | ICD-10-CM | POA: Diagnosis not present

## 2017-11-24 DIAGNOSIS — L814 Other melanin hyperpigmentation: Secondary | ICD-10-CM | POA: Diagnosis not present

## 2017-11-24 DIAGNOSIS — L821 Other seborrheic keratosis: Secondary | ICD-10-CM | POA: Diagnosis not present

## 2017-12-03 DIAGNOSIS — L739 Follicular disorder, unspecified: Secondary | ICD-10-CM | POA: Diagnosis not present

## 2017-12-03 DIAGNOSIS — H60333 Swimmer's ear, bilateral: Secondary | ICD-10-CM | POA: Diagnosis not present

## 2018-01-01 DIAGNOSIS — Z125 Encounter for screening for malignant neoplasm of prostate: Secondary | ICD-10-CM | POA: Diagnosis not present

## 2018-01-01 DIAGNOSIS — E291 Testicular hypofunction: Secondary | ICD-10-CM | POA: Diagnosis not present

## 2018-01-15 DIAGNOSIS — N4341 Spermatocele of epididymis, single: Secondary | ICD-10-CM | POA: Diagnosis not present

## 2018-02-19 DIAGNOSIS — H11002 Unspecified pterygium of left eye: Secondary | ICD-10-CM | POA: Diagnosis not present

## 2018-02-19 DIAGNOSIS — H524 Presbyopia: Secondary | ICD-10-CM | POA: Diagnosis not present

## 2018-03-11 DIAGNOSIS — M5412 Radiculopathy, cervical region: Secondary | ICD-10-CM | POA: Diagnosis not present

## 2018-03-18 DIAGNOSIS — M5412 Radiculopathy, cervical region: Secondary | ICD-10-CM | POA: Diagnosis not present

## 2018-04-07 DIAGNOSIS — H60333 Swimmer's ear, bilateral: Secondary | ICD-10-CM | POA: Diagnosis not present

## 2018-04-14 DIAGNOSIS — M25562 Pain in left knee: Secondary | ICD-10-CM | POA: Diagnosis not present

## 2018-05-15 DIAGNOSIS — Z125 Encounter for screening for malignant neoplasm of prostate: Secondary | ICD-10-CM | POA: Diagnosis not present

## 2018-05-15 DIAGNOSIS — Z23 Encounter for immunization: Secondary | ICD-10-CM | POA: Diagnosis not present

## 2018-05-15 DIAGNOSIS — Z0000 Encounter for general adult medical examination without abnormal findings: Secondary | ICD-10-CM | POA: Diagnosis not present

## 2018-05-15 DIAGNOSIS — M10071 Idiopathic gout, right ankle and foot: Secondary | ICD-10-CM | POA: Diagnosis not present

## 2018-05-15 DIAGNOSIS — E7800 Pure hypercholesterolemia, unspecified: Secondary | ICD-10-CM | POA: Diagnosis not present

## 2018-05-15 DIAGNOSIS — E291 Testicular hypofunction: Secondary | ICD-10-CM | POA: Diagnosis not present

## 2019-03-01 ENCOUNTER — Other Ambulatory Visit: Payer: Self-pay | Admitting: Neurosurgery

## 2019-03-01 DIAGNOSIS — M5416 Radiculopathy, lumbar region: Secondary | ICD-10-CM

## 2019-03-04 ENCOUNTER — Other Ambulatory Visit: Payer: Self-pay | Admitting: Neurosurgery

## 2019-03-09 ENCOUNTER — Ambulatory Visit
Admission: RE | Admit: 2019-03-09 | Discharge: 2019-03-09 | Disposition: A | Discharge status: Home / Self Care | Payer: Commercial Managed Care - PPO | Source: Ambulatory Visit | Attending: Neurosurgery | Admitting: Neurosurgery

## 2019-03-09 ENCOUNTER — Other Ambulatory Visit: Payer: Self-pay

## 2019-03-09 DIAGNOSIS — M5416 Radiculopathy, lumbar region: Secondary | ICD-10-CM

## 2019-03-27 IMAGING — CT CT HEART SCORING
2 series · 16 of 20 positions shown, 18 images · non-contrast
Comparison: None.

CLINICAL DATA: Risk stratification

EXAM:
Coronary Calcium Score
TECHNIQUE: The patient was scanned on a Siemens Force scanner. Axial
non-contrast 3 mm slices were carried out through the heart. The
data set was analyzed on a dedicated work station and scored using
the Agatson method.

[Series 3: casc 3.0 i36f 2 bestdiast 69 % · axial · 0.40mm/px · z∈[-246,-142]mm · 8 of 45 slices shown, 10 images]
[im 5/45  vessel]
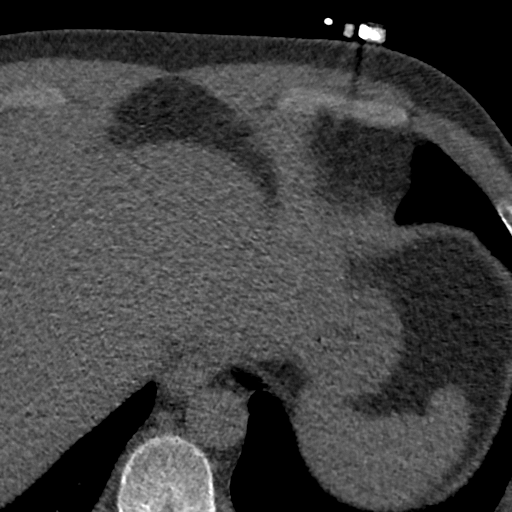
[im 5/45  lung]
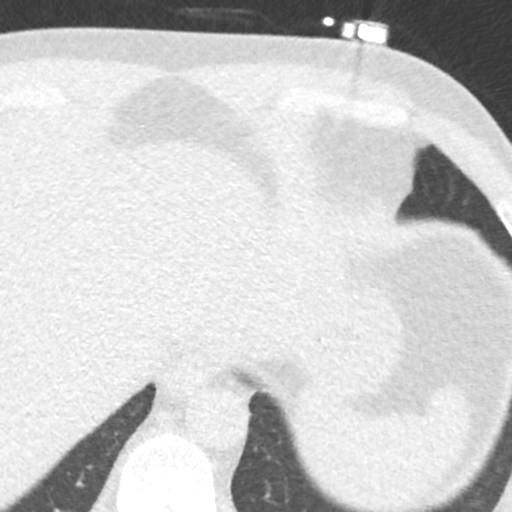
[im 10/45  vessel]
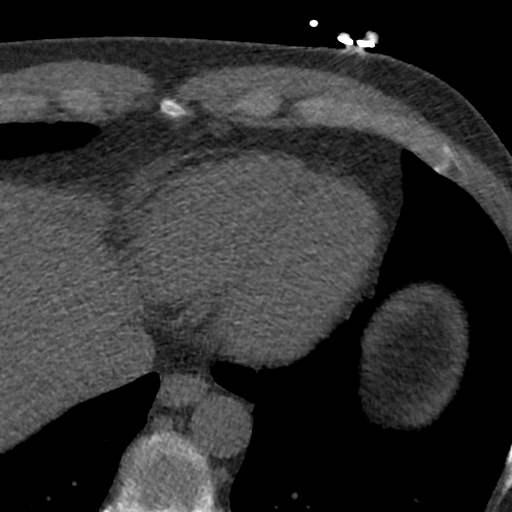
[im 15/45  vessel]
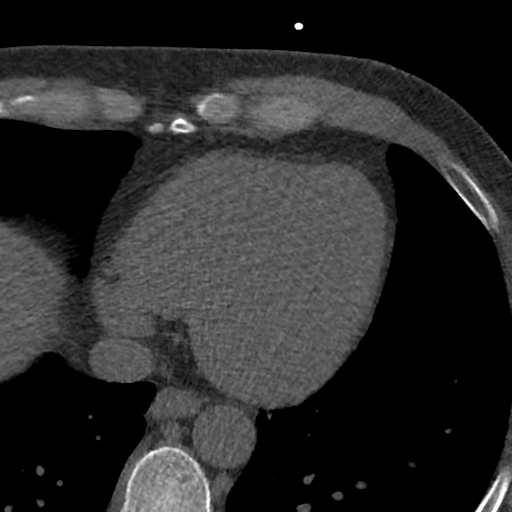
[im 20/45  vessel]
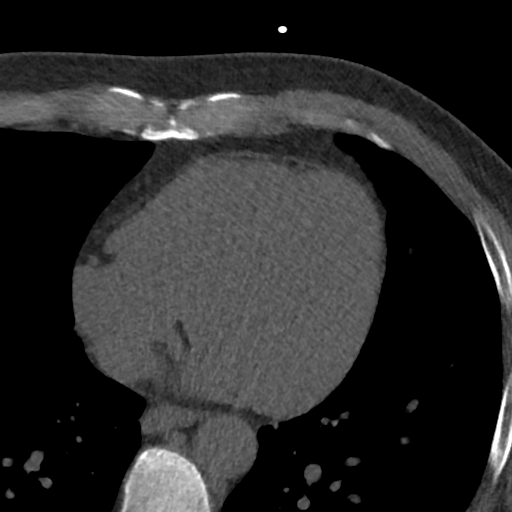
[im 25/45  vessel]
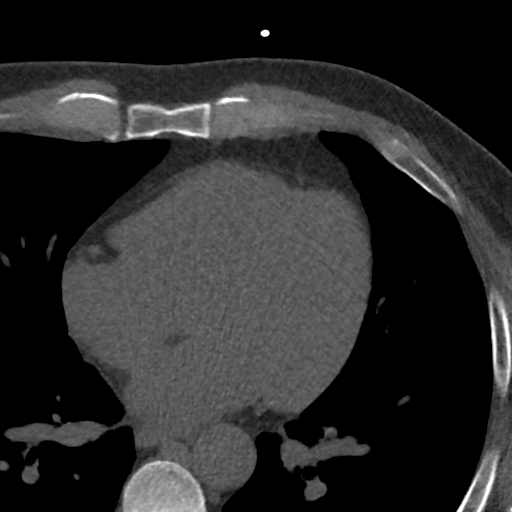
[im 25/45  lung]
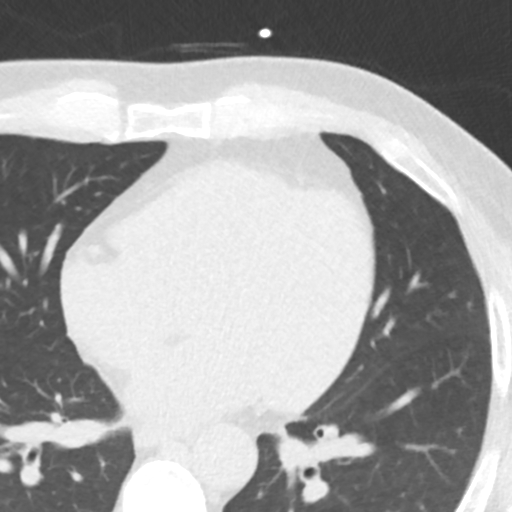
[im 30/45  vessel]
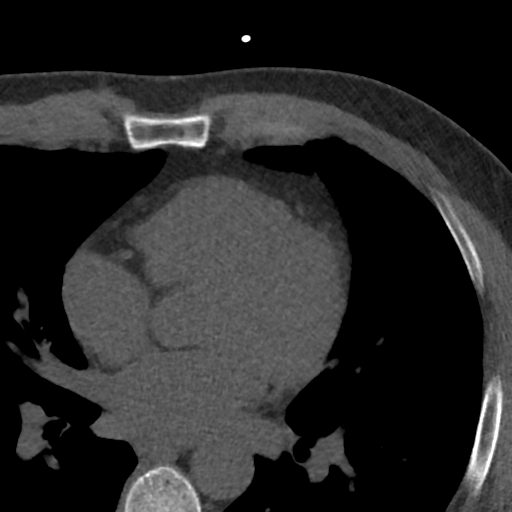
[im 35/45  vessel]
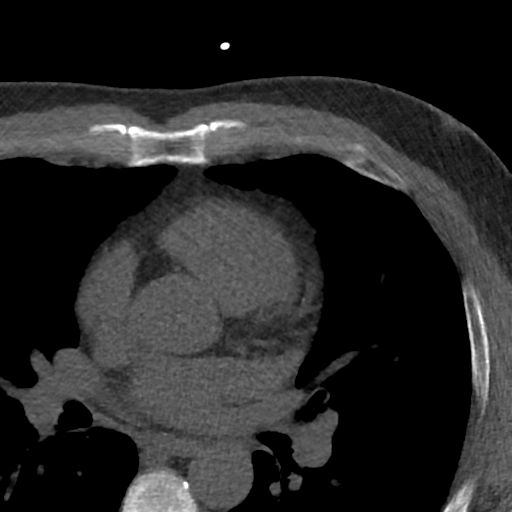
[im 40/45  vessel]
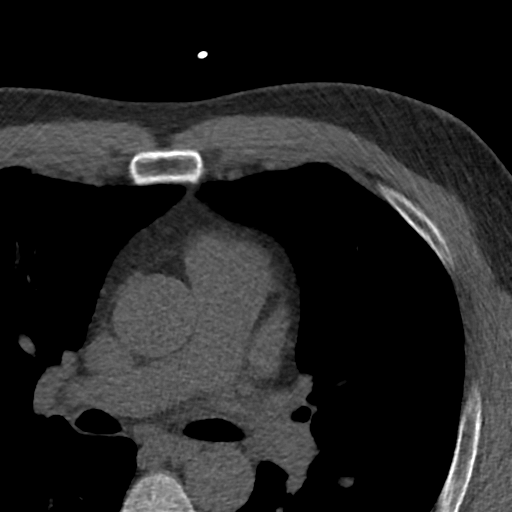

[Series 5: lung st 68 % · axial · 0.81mm/px · z∈[-246,-142]mm · 8 of 45 slices shown]
[im 5/45  lung]
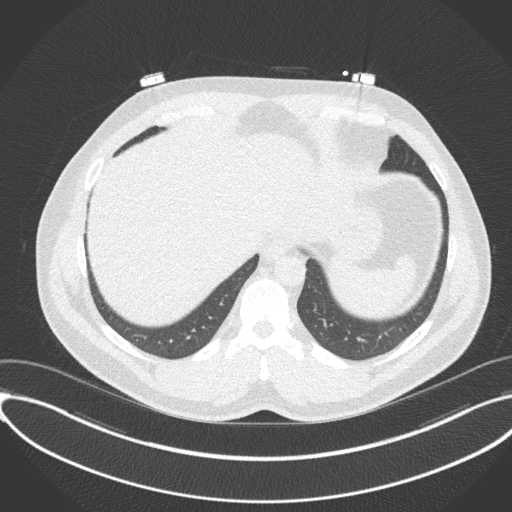
[im 10/45  lung]
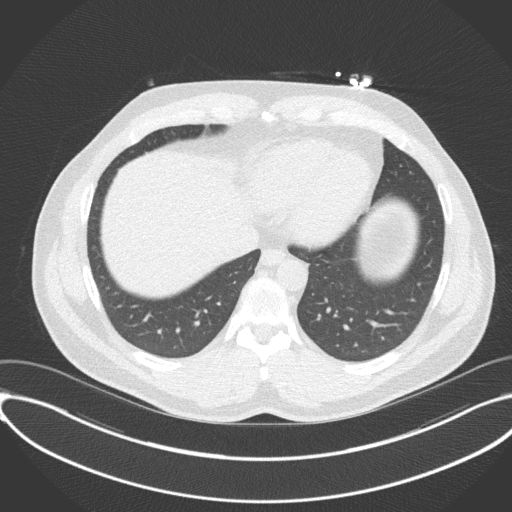
[im 15/45  lung]
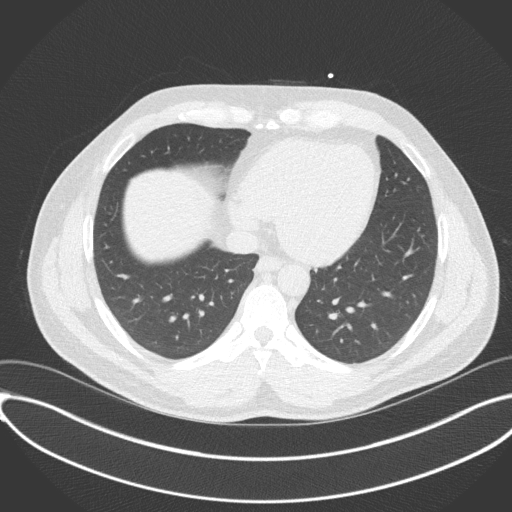
[im 20/45  lung]
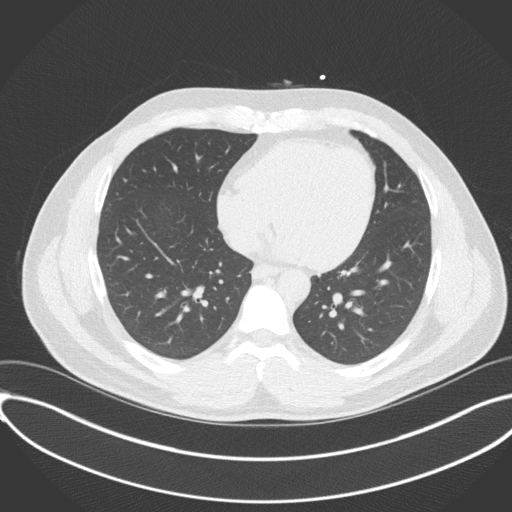
[im 25/45  lung]
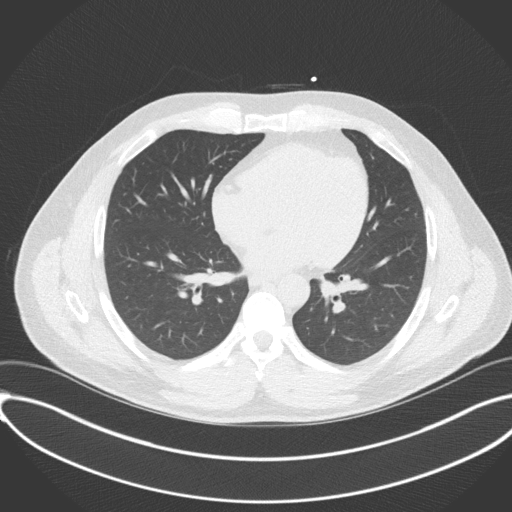
[im 30/45  lung]
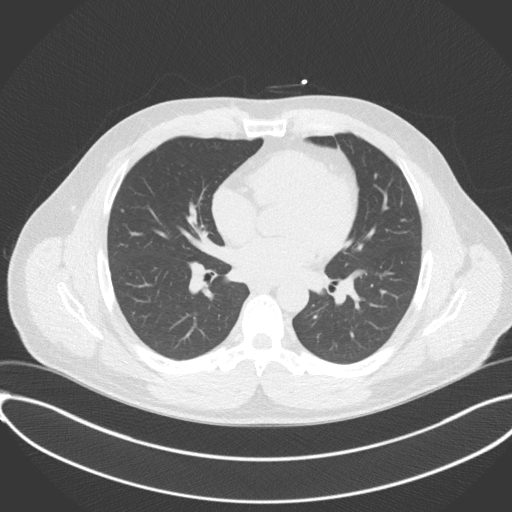
[im 35/45  lung]
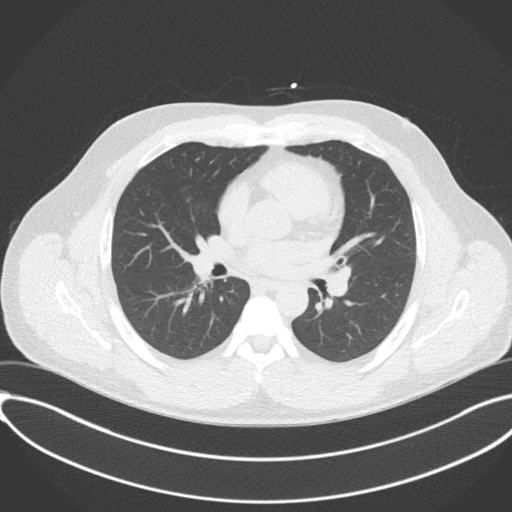
[im 40/45  lung]
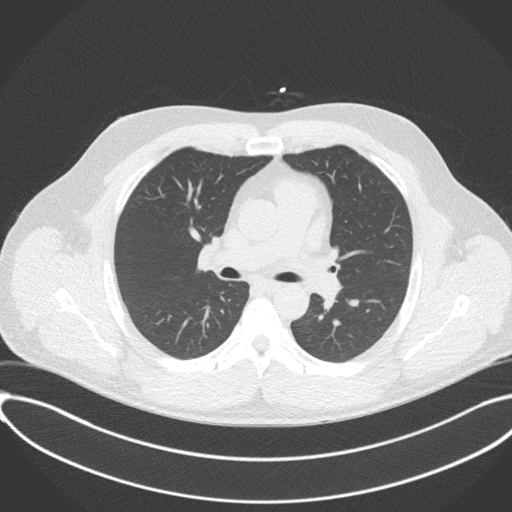

[16 of 20 positions shown; findings below may reference images not displayed]

FINDINGS: Non-cardiac: See separate report from [REDACTED].

Ascending Aorta:  Normal caliber.  No calcifications.

Pericardium: Normal.

Coronary arteries:  Normal origin.
IMPRESSION: Coronary calcium score of 0. This was 0 percentile for age and sex
matched control.

EXAM:
OVER-READ INTERPRETATION  CT CHEST

The following report is an over-read performed by radiologist Dr.
Bedste Robertsen [REDACTED] on 06/19/2017. This over-read
does not include interpretation of cardiac or coronary anatomy or
pathology. The coronary calcium score interpretation by the
cardiologist is attached.
FINDINGS: Vascular: Heart is normal size.  Visualized aorta is normal caliber.

Mediastinum/Nodes: No adenopathy in the lower mediastinum or hila.

Lungs/Pleura: Visualized lungs are clear.  No effusions.

Upper Abdomen: Imaging into the upper abdomen shows no acute
findings.

Musculoskeletal: Chest wall soft tissues are unremarkable. No acute
bony abnormality.
IMPRESSION: No acute or significant extracardiac abnormality.

## 2019-08-13 ENCOUNTER — Other Ambulatory Visit: Payer: Self-pay

## 2019-08-13 ENCOUNTER — Encounter: Payer: Self-pay | Admitting: Cardiology

## 2019-08-13 ENCOUNTER — Ambulatory Visit: Payer: Commercial Managed Care - PPO | Admitting: Cardiology

## 2019-08-13 VITALS — BP 120/78 | HR 59 | Temp 98.4°F | Ht 70.0 in | Wt 229.8 lb

## 2019-08-13 DIAGNOSIS — Z9189 Other specified personal risk factors, not elsewhere classified: Secondary | ICD-10-CM

## 2019-08-13 DIAGNOSIS — R030 Elevated blood-pressure reading, without diagnosis of hypertension: Secondary | ICD-10-CM

## 2019-08-13 DIAGNOSIS — E669 Obesity, unspecified: Secondary | ICD-10-CM | POA: Diagnosis not present

## 2019-08-13 DIAGNOSIS — E785 Hyperlipidemia, unspecified: Secondary | ICD-10-CM | POA: Diagnosis not present

## 2019-08-13 MED ORDER — ROSUVASTATIN CALCIUM 10 MG PO TABS: 10.0000 mg | ORAL_TABLET | Freq: Every day | ORAL | 3 refills | Status: DC

## 2019-08-13 MED ORDER — EZETIMIBE 10 MG PO TABS: 10.0000 mg | ORAL_TABLET | Freq: Every day | ORAL | 3 refills | Status: DC

## 2019-08-13 NOTE — Progress Notes (Signed)
Primary Care Provider: Shirline Frees, MD Cardiologist: No primary care provider on file. Electrophysiologist:   Clinic Note: Chief Complaint  Patient presents with  . Follow-up    No complaints.  Just thought it was time to come back.    HPI:    Lance Walker is a 54 y.o. male with a PMH most notable for mild obesity with OSA on CPAP and hyperlipidemia with elevated LDL and total cholesterol but relatively normal HDL and triglycerides.  Elgie presents today for essentially 2-year follow-up being seen for management of his Darke at the request of Shirline Frees, MD.  Lance Walker was last seen on August 22, 2017.  At that time he was little upset about having gained about 10 pounds.  He had had shoulder surgery which limited his ability to exercise.  He then also acknowledges overindulging during the holidays.  But was asymptomatic from a cardiac standpoint.  At that point we decided to hold off on actually treating his lipids due to minimal risk factors.  He wanted to try to see what he could do with diet and exercise.  Recent Hospitalizations: None  Reviewed  CV studies:    The following studies were reviewed today: (if available, images/films reviewed: From Epic Chart or Care Everywhere) . None:   Interval History:   Lance Walker    CV Review of Symptoms (Summary) Cardiovascular ROS: no chest pain or dyspnea on exertion positive for - Weight gain which has been related to lack of ability to exercise at the gym and the deconditioning. negative for - edema, irregular heartbeat, orthopnea, palpitations, paroxysmal nocturnal dyspnea, rapid heart rate, shortness of breath or Syncope/near syncope, TIA/amaurosis fugax.  Claudication.  The patient does not have symptoms concerning for COVID-19 infection (fever, chills, cough, or new shortness of breath).  The patient is practicing social distancing and masking.   REVIEWED OF SYSTEMS   A  comprehensive ROS was performed. Review of Systems  Constitutional: Negative for malaise/fatigue and weight loss (Has actually gained weight).  HENT: Negative for congestion and nosebleeds.   Respiratory: Negative.        Per HPI  Cardiovascular: Negative.        Per HPI  Gastrointestinal: Negative for blood in stool, melena and nausea.  Musculoskeletal: Positive for joint pain (Off and on but nothing major).  Neurological: Negative.  Negative for dizziness and headaches.  Psychiatric/Behavioral: Negative.    I have reviewed and (if needed) personally updated the patient's problem list, medications, allergies, past medical and surgical history, social and family history.   PAST MEDICAL HISTORY   Past Medical History:  Diagnosis Date  . Diet-controlled hyperlipidemia   . Dysplastic nevus of trunk 08/2016   Mid back  . GERD (gastroesophageal reflux disease)   . H/O seasonal allergies   . Hemorrhoids    With related constipation  . Hypogonadism in male   . OSA on CPAP    Concerns for possible narcolepsy  . Psoriasis    Followed by Dr. Ronnald Ramp     PAST SURGICAL HISTORY   Past Surgical History:  Procedure Laterality Date  . KNEE SURGERY    . NASAL SINUS SURGERY     Uvulopalatopharyngoplasty  . SHOULDER SURGERY Left 08/2016  . TONSILLECTOMY    . VASECTOMY       MEDICATIONS/ALLERGIES   Current Meds  Medication Sig  . allopurinol (ZYLOPRIM) 100 MG tablet Take 1 tablet by mouth daily.  . ASPIRIN 81  PO Take 81 mg daily by mouth.  . cetirizine (ZYRTEC) 10 MG tablet Take 10 mg daily by mouth.  . tamsulosin (FLOMAX) 0.4 MG CAPS capsule Take 1 capsule by mouth daily.    No Known Allergies   SOCIAL HISTORY/FAMILY HISTORY   Social History   Tobacco Use  . Smoking status: Former Smoker    Types: Cigarettes    Quit date: 1995    Years since quitting: 26.0  . Smokeless tobacco: Never Used  . Tobacco comment: quit 1995  Substance Use Topics  . Alcohol use: Yes     Alcohol/week: 4.0 standard drinks    Types: 4 Cans of beer per week    Comment: 3-5 drinks of beer or liquor  . Drug use: No   Social History   Social History Narrative   Married father of 1 daughter.  He lives with his wife and daughter.      Family History family history includes Alcohol abuse in his father; Breast cancer in his mother; Heart disease in his maternal grandfather; Hypertension in his maternal grandfather.   OBJCTIVE -PE, EKG, labs   Wt Readings from Last 3 Encounters:  08/13/19 229 lb 12.8 oz (104.2 kg)  08/22/17 219 lb 6.4 oz (99.5 kg)  06/09/17 216 lb (98 kg)    Physical Exam: BP 120/78   Pulse (!) 59   Temp 98.4 F (36.9 C)   Ht 5\' 10"  (A999333 m)   Wt 229 lb 12.8 oz (104.2 kg)   SpO2 97%   BMI 32.97 kg/m  Physical Exam  Constitutional: He is oriented to person, place, and time. He appears well-developed and well-nourished. No distress.  Relatively healthy-appearing, but now meets criteria for mildly obese.  Well-groomed.  HENT:  Head: Normocephalic and atraumatic.  Neck: No hepatojugular reflux and no JVD present. Carotid bruit is not present.  Cardiovascular: Normal rate, regular rhythm and normal pulses.  No extrasystoles are present. PMI is not displaced. Exam reveals no gallop and no friction rub.  No murmur heard. Pulmonary/Chest: Effort normal and breath sounds normal. No respiratory distress. He has no wheezes. He has no rales.  Abdominal: Soft. Bowel sounds are normal. He exhibits no distension. There is no abdominal tenderness. There is no rebound.  No HSM  Musculoskeletal:        General: No edema. Normal range of motion.     Cervical back: Normal range of motion and neck supple.  Neurological: He is alert and oriented to person, place, and time.  Psychiatric: He has a normal mood and affect. His behavior is normal. Judgment and thought content normal.  Vitals reviewed.    Adult ECG Report  Rate: 59 ;  Rhythm: sinus bradycardia and  Otherwise normal EKG.  Normal axis, intervals and durations.;   Narrative Interpretation: Normal EKG.  Recent Labs: 04/04/2019: TC 254, TG 146, HDL 40, LDL 186.  Hgb 15.8.Cr 4.6.  ALT 189. No results found for: CHOL, HDL, LDLCALC, LDLDIRECT, TRIG, CHOLHDL No results found for: CREATININE, BUN, NA, K, CL, CO2  ASSESSMENT/PLAN    Problem List Items Addressed This Visit    Hyperlipidemia with target LDL less than 130 - Primary (Chronic)    Probably would prefer to have an LDL less than 100, but with extremely low coronary calcium score no significant other risk factors besides mild obesity, would like to at least try to get it less than 130 for now.  His most recent lipids actually look worse in November 2020 than  they had in 2018.  LDL now up to 186.  I do not think it we can drop this down with diet and exercise alone as it has not happened yet.  There is been question about what medication options to try.  He has had a history of elevated LFTs.  I do not have those labs as of being followed by his PCP.  He is due to get labs checked by his PCP soon in probably May timeframe.  Plan for now will be to try to start low-dose rosuvastatin 10 mg plus ezetimibe 10 mg (to minimize statin dose and therefore hepatic effect) -> we will recheck LFTs in roughly 6 weeks but will wait for his lipids to be rechecked when he sees his PCP.      Relevant Medications   rosuvastatin (CRESTOR) 10 MG tablet   ezetimibe (ZETIA) 10 MG tablet   Other Relevant Orders   EKG 12-Lead   Hepatic function panel   Obesity (BMI 30.0-34.9) (Chronic)    Hopefully as he gets back and exercise he will lose back some of the weight. We talked again about the importance of dietary modification especially with his lipids and hopefully once the gyms open back up again he will be to get some exercise but I told him he needs at least do some walking.      Borderline hypertension (Chronic)    Blood pressure looks great today on no  medications.      Cardiovascular risk factor    Mildly obese gentleman with hyperlipidemia, but relatively normal HDL and triglycerides despite high LDL.  He is usually very active but does CrossFit training, but unfortunately he has gained quite a bit of weight states has not been able to go to the gym as of the COVID-19 lockdown of the gym in March of last year.  I think once he is able to get back and exercise he will be able lose weight which may help his lipids some, but plan to start low-dose of rosuvastatin supplemented by ezetimibe.          COVID-19 Education: The signs and symptoms of COVID-19 were discussed with the patient and how to seek care for testing (follow up with PCP or arrange E-visit).   The importance of social distancing was discussed today.  I spent a total of 18 minutes with the patient. >  50% of the time was spent in direct patient consultation.  Additional time spent with chart review (studies, outside notes, etc): 6 Total Time: 24 min   Current medicines are reviewed at length with the patient today.  (+/- concerns) none   Patient Instructions / Medication Changes & Studies & Tests Ordered   Patient Instructions  Medication Instructions:  START crestor 10mg  daily START zetia 10mg  daily  *If you need a refill on your cardiac medications before your next appointment, please call your pharmacy*  Lab Work: Liver Function Testing in 6 weeks Lab work with PCP in the summer to check cholesterol  If you have labs (blood work) drawn today and your tests are completely normal, you will receive your results only by: Marland Kitchen MyChart Message (if you have MyChart) OR . A paper copy in the mail If you have any lab test that is abnormal or we need to change your treatment, we will call you to review the results.  Follow-Up: At [facility], you and your health needs are our priority.  As part of our continuing mission to  provide you with exceptional heart care,  we have created designated Provider Care Teams.  These Care Teams include your primary Cardiologist (physician) and Advanced Practice Providers (APPs -  Physician Assistants and Nurse Practitioners) who all work together to provide you with the care you need, when you need it.  Your next appointment:   12 month(s)  The format for your next appointment:   In Person  Provider:   You may see Dr. Ellyn Hack or one of the following Advanced Practice Providers on your designated Care Team:    Rosaria Ferries, PA-C  Jory Sims, DNP, ANP  Cadence Kathlen Mody, NP   Other Instructions  Dr. Ellyn Hack would also like you to follow up with [facility] pharmacist in July 2021     Studies Ordered:   Orders Placed This Encounter  Procedures  . Hepatic function panel  . EKG 12-Lead     Glenetta Hew, M.D., M.S. Interventional Cardiologist   Pager # [phone] Phone # [phone] [address] [place],  [address]   Thank you for choosing Heartcare at [facility]!!

## 2019-08-13 NOTE — Patient Instructions (Signed)
Medication Instructions:  START crestor 10mg  daily START zetia 10mg  daily  *If you need a refill on your cardiac medications before your next appointment, please call your pharmacy*  Lab Work: Liver Function Testing in 6 weeks Lab work with PCP in the summer to check cholesterol  If you have labs (blood work) drawn today and your tests are completely normal, you will receive your results only by: Marland Kitchen MyChart Message (if you have MyChart) OR . A paper copy in the mail If you have any lab test that is abnormal or we need to change your treatment, we will call you to review the results.  Follow-Up: At [facility], you and your health needs are our priority.  As part of our continuing mission to provide you with exceptional heart care, we have created designated Provider Care Teams.  These Care Teams include your primary Cardiologist (physician) and Advanced Practice Providers (APPs -  Physician Assistants and Nurse Practitioners) who all work together to provide you with the care you need, when you need it.  Your next appointment:   12 month(s)  The format for your next appointment:   In Person  Provider:   You may see Dr. Ellyn Hack or one of the following Advanced Practice Providers on your designated Care Team:    Rosaria Ferries, PA-C  Jory Sims, DNP, ANP  Cadence Kathlen Mody, NP   Other Instructions  Dr. Ellyn Hack would also like you to follow up with [facility] pharmacist in July 2021

## 2019-08-14 ENCOUNTER — Encounter: Payer: Self-pay | Admitting: Cardiology

## 2019-08-14 NOTE — Assessment & Plan Note (Signed)
Hopefully as he gets back and exercise he will lose back some of the weight. We talked again about the importance of dietary modification especially with his lipids and hopefully once the gyms open back up again he will be to get some exercise but I told him he needs at least do some walking.

## 2019-08-14 NOTE — Assessment & Plan Note (Signed)
Probably would prefer to have an LDL less than 100, but with extremely low coronary calcium score no significant other risk factors besides mild obesity, would like to at least try to get it less than 130 for now.  His most recent lipids actually look worse in November 2020 than they had in 2018.  LDL now up to 186.  I do not think it we can drop this down with diet and exercise alone as it has not happened yet.  There is been question about what medication options to try.  He has had a history of elevated LFTs.  I do not have those labs as of being followed by his PCP.  He is due to get labs checked by his PCP soon in probably May timeframe.  Plan for now will be to try to start low-dose rosuvastatin 10 mg plus ezetimibe 10 mg (to minimize statin dose and therefore hepatic effect) -> we will recheck LFTs in roughly 6 weeks but will wait for his lipids to be rechecked when he sees his PCP.

## 2019-08-14 NOTE — Assessment & Plan Note (Signed)
Blood pressure looks great today on no medications.

## 2019-08-14 NOTE — Assessment & Plan Note (Signed)
Mildly obese gentleman with hyperlipidemia, but relatively normal HDL and triglycerides despite high LDL.  He is usually very active but does CrossFit training, but unfortunately he has gained quite a bit of weight states has not been able to go to the gym as of the COVID-19 lockdown of the gym in March of last year.  I think once he is able to get back and exercise he will be able lose weight which may help his lipids some, but plan to start low-dose of rosuvastatin supplemented by ezetimibe.

## 2019-10-19 ENCOUNTER — Other Ambulatory Visit: Payer: Self-pay | Admitting: Cardiology

## 2019-10-19 NOTE — Telephone Encounter (Signed)
*  STAT* If patient is at the pharmacy, call can be transferred to refill team.   1. Which medications need to be refilled? (please list name of each medication and dose if known)  rosuvastatin (CRESTOR) 10 MG tablet ezetimibe (ZETIA) 10 MG tablet  2. Which pharmacy/location (including street and city if local pharmacy) is medication to be sent to? [facility] fax #: [phone]  3. Do they need a 30 day or 90 day supply? 90 day supply

## 2019-10-20 MED ORDER — ROSUVASTATIN CALCIUM 10 MG PO TABS: 10.0000 mg | ORAL_TABLET | Freq: Every day | ORAL | 2 refills | Status: DC

## 2019-10-20 MED ORDER — EZETIMIBE 10 MG PO TABS: 10.0000 mg | ORAL_TABLET | Freq: Every day | ORAL | 2 refills | Status: DC

## 2019-10-26 ENCOUNTER — Other Ambulatory Visit: Payer: Self-pay

## 2019-10-26 MED ORDER — ROSUVASTATIN CALCIUM 10 MG PO TABS: 10.0000 mg | ORAL_TABLET | Freq: Every day | ORAL | 2 refills | Status: DC

## 2019-10-26 MED ORDER — EZETIMIBE 10 MG PO TABS: 10.0000 mg | ORAL_TABLET | Freq: Every day | ORAL | 2 refills | Status: DC

## 2019-10-26 NOTE — Telephone Encounter (Signed)
Pt called stating that he changed pharmacies to [facility] and would like his medications to be resent to this pharmacy. Please address

## 2020-05-29 ENCOUNTER — Other Ambulatory Visit (HOSPITAL_COMMUNITY): Payer: Self-pay

## 2020-05-30 ENCOUNTER — Ambulatory Visit (HOSPITAL_COMMUNITY)
Admission: RE | Admit: 2020-05-30 | Discharge: 2020-05-30 | Disposition: A | Discharge status: Home / Self Care | Payer: Commercial Managed Care - PPO | Source: Ambulatory Visit | Attending: Pulmonary Disease | Admitting: Pulmonary Disease

## 2020-05-30 ENCOUNTER — Telehealth: Payer: Self-pay | Admitting: Nurse Practitioner

## 2020-05-30 ENCOUNTER — Other Ambulatory Visit: Payer: Self-pay | Admitting: Adult Health

## 2020-05-30 ENCOUNTER — Encounter: Payer: Self-pay | Admitting: Nurse Practitioner

## 2020-05-30 DIAGNOSIS — U071 COVID-19: Secondary | ICD-10-CM

## 2020-05-30 MED ORDER — METHYLPREDNISOLONE SODIUM SUCC 125 MG IJ SOLR: 125.0000 mg | Freq: Once | INTRAMUSCULAR | Status: DC | PRN

## 2020-05-30 MED ORDER — FAMOTIDINE IN NACL 20-0.9 MG/50ML-% IV SOLN: 20.0000 mg | Freq: Once | INTRAVENOUS | Status: DC | PRN

## 2020-05-30 MED ORDER — DIPHENHYDRAMINE HCL 50 MG/ML IJ SOLN: 50.0000 mg | Freq: Once | INTRAMUSCULAR | Status: DC | PRN

## 2020-05-30 MED ORDER — SODIUM CHLORIDE 0.9 % IV SOLN: INTRAVENOUS | Status: DC | PRN

## 2020-05-30 MED ORDER — SOTROVIMAB 500 MG/8ML IV SOLN
500.0000 mg | Freq: Once | INTRAVENOUS | Status: AC
  Administered 2020-05-30: 500 mg via INTRAVENOUS

## 2020-05-30 MED ORDER — EPINEPHRINE 0.3 MG/0.3ML IJ SOAJ: 0.3000 mg | Freq: Once | INTRAMUSCULAR | Status: DC | PRN

## 2020-05-30 MED ORDER — ALBUTEROL SULFATE HFA 108 (90 BASE) MCG/ACT IN AERS: 2.0000 | INHALATION_SPRAY | Freq: Once | RESPIRATORY_TRACT | Status: DC | PRN

## 2020-05-30 NOTE — Telephone Encounter (Signed)
Called to Discuss with patient about Covid symptoms and the use of a monoclonal antibody infusion for those with mild to moderate Covid symptoms and at a high risk of hospitalization.     Pt is qualified for this infusion at the [facility] due to co-morbid conditions and/or a member of an at-risk group.     Unable to reach pt. Left message to return call. Sent [employer].   Beckey Rutter, Carpenter, AGNP-C [phone] ([place])

## 2020-05-30 NOTE — Discharge Instructions (Signed)
10 Things You Can Do to Manage Your COVID-19 Symptoms at Home If you have possible or confirmed COVID-19: 1. Stay home from work and school. And stay away from other public places. If you must go out, avoid using any kind of public transportation, ridesharing, or taxis. 2. Monitor your symptoms carefully. If your symptoms get worse, call your healthcare provider immediately. 3. Get rest and stay hydrated. 4. If you have a medical appointment, call the healthcare provider ahead of time and tell them that you have or may have COVID-19. 5. For medical emergencies, call 911 and notify the dispatch personnel that you have or may have COVID-19. 6. Cover your cough and sneezes with a tissue or use the inside of your elbow. 7. Wash your hands often with soap and water for at least 20 seconds or clean your hands with an alcohol-based hand sanitizer that contains at least 60% alcohol. 8. As much as possible, stay in a specific room and away from other people in your home. Also, you should use a separate bathroom, if available. If you need to be around other people in or outside of the home, wear a mask. 9. Avoid sharing personal items with other people in your household, like dishes, towels, and bedding. 10. Clean all surfaces that are touched often, like counters, tabletops, and doorknobs. Use household cleaning sprays or wipes according to the label instructions. cdc.gov/coronavirus 01/20/2019 This information is not intended to replace advice given to you by your health care provider. Make sure you discuss any questions you have with your health care provider. Document Revised: 06/24/2019 Document Reviewed: 06/24/2019 Elsevier Patient Education  2020 [employer].   COVID-19 COVID-19 is a respiratory infection that is caused by a virus called severe acute respiratory syndrome coronavirus 2 (SARS-CoV-2). The disease is also known as coronavirus disease or novel coronavirus. In some people, the virus may  not cause any symptoms. In others, it may cause a serious infection. The infection can get worse quickly and can lead to complications, such as:  Pneumonia, or infection of the lungs.  Acute respiratory distress syndrome or ARDS. This is a condition in which fluid build-up in the lungs prevents the lungs from filling with air and passing oxygen into the blood.  Acute respiratory failure. This is a condition in which there is not enough oxygen passing from the lungs to the body or when carbon dioxide is not passing from the lungs out of the body.  Sepsis or septic shock. This is a serious bodily reaction to an infection.  Blood clotting problems.  Secondary infections due to bacteria or fungus.  Organ failure. This is when your body's organs stop working. The virus that causes COVID-19 is contagious. This means that it can spread from person to person through droplets from coughs and sneezes (respiratory secretions). What are the causes? This illness is caused by a virus. You may catch the virus by:  Breathing in droplets from an infected person. Droplets can be spread by a person breathing, speaking, singing, coughing, or sneezing.  Touching something, like a table or a doorknob, that was exposed to the virus (contaminated) and then touching your mouth, nose, or eyes. What increases the risk? Risk for infection You are more likely to be infected with this virus if you:  Are within 6 feet (2 meters) of a person with COVID-19.  Provide care for or live with a person who is infected with COVID-19.  Spend time in crowded indoor spaces or   live in shared housing. Risk for serious illness You are more likely to become seriously ill from the virus if you:  Are 50 years of age or older. The higher your age, the more you are at risk for serious illness.  Live in a nursing home or long-term care facility.  Have cancer.  Have a long-term (chronic) disease such as: ? Chronic lung disease,  including chronic obstructive pulmonary disease or asthma. ? A long-term disease that lowers your body's ability to fight infection (immunocompromised). ? Heart disease, including heart failure, a condition in which the arteries that lead to the heart become narrow or blocked (coronary artery disease), a disease which makes the heart muscle thick, weak, or stiff (cardiomyopathy). ? Diabetes. ? Chronic kidney disease. ? Sickle cell disease, a condition in which red blood cells have an abnormal "sickle" shape. ? Liver disease.  Are obese. What are the signs or symptoms? Symptoms of this condition can range from mild to severe. Symptoms may appear any time from 2 to 14 days after being exposed to the virus. They include:  A fever or chills.  A cough.  Difficulty breathing.  Headaches, body aches, or muscle aches.  Runny or stuffy (congested) nose.  A sore throat.  New loss of taste or smell. Some people may also have stomach problems, such as nausea, vomiting, or diarrhea. Other people may not have any symptoms of COVID-19. How is this diagnosed? This condition may be diagnosed based on:  Your signs and symptoms, especially if: ? You live in an area with a COVID-19 outbreak. ? You recently traveled to or from an area where the virus is common. ? You provide care for or live with a person who was diagnosed with COVID-19. ? You were exposed to a person who was diagnosed with COVID-19.  A physical exam.  Lab tests, which may include: ? Taking a sample of fluid from the back of your nose and throat (nasopharyngeal fluid), your nose, or your throat using a swab. ? A sample of mucus from your lungs (sputum). ? Blood tests.  Imaging tests, which may include, X-rays, CT scan, or ultrasound. How is this treated? At present, there is no medicine to treat COVID-19. Medicines that treat other diseases are being used on a trial basis to see if they are effective against COVID-19. Your  health care provider will talk with you about ways to treat your symptoms. For most people, the infection is mild and can be managed at home with rest, fluids, and over-the-counter medicines. Treatment for a serious infection usually takes places in a hospital intensive care unit (ICU). It may include one or more of the following treatments. These treatments are given until your symptoms improve.  Receiving fluids and medicines through an IV.  Supplemental oxygen. Extra oxygen is given through a tube in the nose, a face mask, or a hood.  Positioning you to lie on your stomach (prone position). This makes it easier for oxygen to get into the lungs.  Continuous positive airway pressure (CPAP) or bi-level positive airway pressure (BPAP) machine. This treatment uses mild air pressure to keep the airways open. A tube that is connected to a motor delivers oxygen to the body.  Ventilator. This treatment moves air into and out of the lungs by using a tube that is placed in your windpipe.  Tracheostomy. This is a procedure to create a hole in the neck so that a breathing tube can be inserted.  Extracorporeal membrane   oxygenation (ECMO). This procedure gives the lungs a chance to recover by taking over the functions of the heart and lungs. It supplies oxygen to the body and removes carbon dioxide. Follow these instructions at home: Lifestyle  If you are sick, stay home except to get medical care. Your health care provider will tell you how long to stay home. Call your health care provider before you go for medical care.  Rest at home as told by your health care provider.  Do not use any products that contain nicotine or tobacco, such as cigarettes, e-cigarettes, and chewing tobacco. If you need help quitting, ask your health care provider.  Return to your normal activities as told by your health care provider. Ask your health care provider what activities are safe for you. General  instructions  Take over-the-counter and prescription medicines only as told by your health care provider.  Drink enough fluid to keep your urine pale yellow.  Keep all follow-up visits as told by your health care provider. This is important. How is this prevented?  There is no vaccine to help prevent COVID-19 infection. However, there are steps you can take to protect yourself and others from this virus. To protect yourself:   Do not travel to areas where COVID-19 is a risk. The areas where COVID-19 is reported change often. To identify high-risk areas and travel restrictions, check the CDC travel website: wwwnc.cdc.gov/travel/notices  If you live in, or must travel to, an area where COVID-19 is a risk, take precautions to avoid infection. ? Stay away from people who are sick. ? Wash your hands often with soap and water for 20 seconds. If soap and water are not available, use an alcohol-based hand sanitizer. ? Avoid touching your mouth, face, eyes, or nose. ? Avoid going out in public, follow guidance from your state and local health authorities. ? If you must go out in public, wear a cloth face covering or face mask. Make sure your mask covers your nose and mouth. ? Avoid crowded indoor spaces. Stay at least 6 feet (2 meters) away from others. ? Disinfect objects and surfaces that are frequently touched every day. This may include:  Counters and tables.  Doorknobs and light switches.  Sinks and faucets.  Electronics, such as phones, remote controls, keyboards, computers, and tablets. To protect others: If you have symptoms of COVID-19, take steps to prevent the virus from spreading to others.  If you think you have a COVID-19 infection, contact your health care provider right away. Tell your health care team that you think you may have a COVID-19 infection.  Stay home. Leave your house only to seek medical care. Do not use public transport.  Do not travel while you are  sick.  Wash your hands often with soap and water for 20 seconds. If soap and water are not available, use alcohol-based hand sanitizer.  Stay away from other members of your household. Let healthy household members care for children and pets, if possible. If you have to care for children or pets, wash your hands often and wear a mask. If possible, stay in your own room, separate from others. Use a different bathroom.  Make sure that all people in your household wash their hands well and often.  Cough or sneeze into a tissue or your sleeve or elbow. Do not cough or sneeze into your hand or into the air.  Wear a cloth face covering or face mask. Make sure your mask covers your nose   and mouth. Where to find more information  Centers for Disease Control and Prevention: www.cdc.gov/coronavirus/2019-ncov/index.html  World Health Organization: www.who.int/health-topics/coronavirus Contact a health care provider if:  You live in or have traveled to an area where COVID-19 is a risk and you have symptoms of the infection.  You have had contact with someone who has COVID-19 and you have symptoms of the infection. Get help right away if:  You have trouble breathing.  You have pain or pressure in your chest.  You have confusion.  You have bluish lips and fingernails.  You have difficulty waking from sleep.  You have symptoms that get worse. These symptoms may represent a serious problem that is an emergency. Do not wait to see if the symptoms will go away. Get medical help right away. Call your local emergency services (911 in the [place] Do not drive yourself to the hospital. Let the emergency medical personnel know if you think you have COVID-19. Summary  COVID-19 is a respiratory infection that is caused by a virus. It is also known as coronavirus disease or novel coronavirus. It can cause serious infections, such as pneumonia, acute respiratory distress syndrome, acute respiratory failure,  or sepsis.  The virus that causes COVID-19 is contagious. This means that it can spread from person to person through droplets from breathing, speaking, singing, coughing, or sneezing.  You are more likely to develop a serious illness if you are 50 years of age or older, have a weak immune system, live in a nursing home, or have chronic disease.  There is no medicine to treat COVID-19. Your health care provider will talk with you about ways to treat your symptoms.  Take steps to protect yourself and others from infection. Wash your hands often and disinfect objects and surfaces that are frequently touched every day. Stay away from people who are sick and wear a mask if you are sick. This information is not intended to replace advice given to you by your health care provider. Make sure you discuss any questions you have with your health care provider. Document Revised: 05/07/2019 Document Reviewed: 08/13/2018 Elsevier Patient Education  2020 Elsevier Inc.  What types of side effects do monoclonal antibody drugs cause?  Common side effects  In general, the more common side effects caused by monoclonal antibody drugs include: . Allergic reactions, such as hives or itching . Flu-like signs and symptoms, including chills, fatigue, fever, and muscle aches and pains . Nausea, vomiting . Diarrhea . Skin rashes . Low blood pressure   The CDC is recommending patients who receive monoclonal antibody treatments wait at least 90 days before being vaccinated.  Currently, there are no data on the safety and efficacy of mRNA COVID-19 vaccines in persons who received monoclonal antibodies or convalescent plasma as part of COVID-19 treatment. Based on the estimated half-life of such therapies as well as evidence suggesting that reinfection is uncommon in the 90 days after initial infection, vaccination should be deferred for at least 90 days, as a precautionary measure until additional information becomes  available, to avoid interference of the antibody treatment with vaccine-induced immune responses.  

## 2020-05-30 NOTE — Progress Notes (Signed)
  Diagnosis: COVID-19  Physician: Dr Joya Gaskins  Procedure: [facility] Med: bamlanivimab\etesevimab infusion - Provided patient with bamlanimivab\etesevimab fact sheet for patients, parents and caregivers prior to infusion.  Complications: No immediate complications noted.  Discharge: Discharged home   Ashley Murrain 05/30/2020

## 2020-05-30 NOTE — Progress Notes (Signed)
I connected by phone with [name] on 05/30/2020 at 12:14 PM to discuss the potential use of a new treatment for mild to moderate COVID-19 viral infection in non-hospitalized patients.  This patient is a 54 y.o. male that meets the FDA criteria for Emergency Use Authorization of COVID monoclonal antibody casirivimab/imdevimab, bamlanivimab/eteseviamb, or sotrovimab.  Has a (+) direct SARS-CoV-2 viral test result  Has mild or moderate COVID-19   Is NOT hospitalized due to COVID-19  Is within 10 days of symptom onset  Has at least one of the high risk factor(s) for progression to severe COVID-19 and/or hospitalization as defined in EUA.  Specific high risk criteria : BMI > 25   I have spoken and communicated the following to the patient or parent/caregiver regarding COVID monoclonal antibody treatment:  1. FDA has authorized the emergency use for the treatment of mild to moderate COVID-19 in adults and pediatric patients with positive results of direct SARS-CoV-2 viral testing who are 55 years of age and older weighing at least 40 kg, and who are at high risk for progressing to severe COVID-19 and/or hospitalization.  2. The significant known and potential risks and benefits of COVID monoclonal antibody, and the extent to which such potential risks and benefits are unknown.  3. Information on available alternative treatments and the risks and benefits of those alternatives, including clinical trials.  4. Patients treated with COVID monoclonal antibody should continue to self-isolate and use infection control measures (e.g., wear mask, isolate, social distance, avoid sharing personal items, clean and disinfect "high touch" surfaces, and frequent handwashing) according to CDC guidelines.   5. The patient or parent/caregiver has the option to accept or refuse COVID monoclonal antibody treatment. 6. Cost reviewed.  Sx onset 11/1  After reviewing this information with the patient, the  patient has agreed to receive one of the available covid 19 monoclonal antibodies and will be provided an appropriate fact sheet prior to infusion. Scot Dock, NP 05/30/2020 12:14 PM

## 2020-07-17 ENCOUNTER — Other Ambulatory Visit: Payer: Self-pay | Admitting: Cardiology

## 2020-09-15 ENCOUNTER — Ambulatory Visit (INDEPENDENT_AMBULATORY_CARE_PROVIDER_SITE_OTHER): Payer: Commercial Managed Care - PPO | Admitting: Cardiology

## 2020-09-15 ENCOUNTER — Other Ambulatory Visit: Payer: Self-pay

## 2020-09-15 ENCOUNTER — Encounter: Payer: Self-pay | Admitting: Cardiology

## 2020-09-15 VITALS — BP 112/80 | HR 47 | Ht 70.0 in | Wt 230.0 lb

## 2020-09-15 DIAGNOSIS — E785 Hyperlipidemia, unspecified: Secondary | ICD-10-CM

## 2020-09-15 DIAGNOSIS — Z9189 Other specified personal risk factors, not elsewhere classified: Secondary | ICD-10-CM

## 2020-09-15 DIAGNOSIS — R030 Elevated blood-pressure reading, without diagnosis of hypertension: Secondary | ICD-10-CM

## 2020-09-15 DIAGNOSIS — E669 Obesity, unspecified: Secondary | ICD-10-CM | POA: Diagnosis not present

## 2020-09-15 NOTE — Assessment & Plan Note (Signed)
Lipids look great on current medications.  Reluctant to titrate statin further because of his underlying elevated LFTs.  The addition of Zetia has made a dramatic difference. We talked about continued dietary modification and exercise to work on weight loss.

## 2020-09-15 NOTE — Progress Notes (Signed)
Primary Care Provider: Shirline Frees, MD Cardiologist: No primary care provider on file. Electrophysiologist: None  Clinic Note: Chief Complaint  Patient presents with  . Follow-up    No major complaints.  Just deconditioned  . Hyperlipidemia    Tolerating low-dose rosuvastatin plus Zetia without issues.  Most recent lipids look great.   ===================================  ASSESSMENT/PLAN   Problem List Items Addressed This Visit    Hyperlipidemia with target LDL less than 130 - Primary (Chronic)    Lipids look great on current medications.  Reluctant to titrate statin further because of his underlying elevated LFTs.  The addition of Zetia has made a dramatic difference. We talked about continued dietary modification and exercise to work on weight loss.      Cardiovascular risk factor (Chronic)    CRFs include obesity and dyslipidemia, but no prediabetes or hypertension.  No active symptoms.  No need for screening test based on the fact that he is adequately controlled right now as was instructed to go.  Only needs to work on weight loss.      Obesity (BMI 30.0-34.9) (Chronic)    Unfortunately, he got out of the habit of doing exercise during the initial outbreak of Covid.  Now lacks motivation to get back into the exercise regimen.  We talked about making sure that he sets that time to exercise.  He has a very busy work life schedule with frequent travels.  He needs to monitor his diet      Borderline hypertension (Chronic)    Blood pressure looks great today also.  No meds        ===================================  HPI:    Lance Walker is a mildly obese 55 y.o. male with a PMH notable for OSA-CPAP, HLD elevated LDL and TC but normal HDL and TG) who presents today for delayed annual follow-up.  Lance Walker was last seen on August 14, 2019.  Was doing very well with no major complaints.  Interestingly gained a lot of exercise and deconditioning.   Medicine joint pain.  Recent Hospitalizations:   Not hospitalized, but COVID-19 infection in May 22 2020-had infusion on 05/30/2020 -mild case w/only 2-3 days HA, aches & loss of smell -completely recovered.   Reviewed  CV studies:    The following studies were reviewed today: (if available, images/films reviewed: From Epic Chart or Care Everywhere) . None:   Interval History:   Lance Walker returns today doing well.  No major issues other than lack of exercise since gyms closed down - has gained some weight & has a hard time getting motivated to exercise.  He says that he has a home reinitiating that he would like to try to start using-set up in his "man cave ", he just has issues with motivation.  He and his work partner have talked about joining the [employer] again to go back and do classes together.  He is deconditioned and therefore has some exertional dyspnea, but otherwise has no active cardiac symptoms of chest tightness or pressure with rest or exertion.  CV Review of Symptoms (Summary): positive for - dyspnea on exertion and This is more related to deconditioning. negative for - chest pain, edema, irregular heartbeat, orthopnea, palpitations, paroxysmal nocturnal dyspnea, rapid heart rate, shortness of breath or Lightheadedness, dizziness or syncope/near syncope, TIA/amaurosis fugax claudication  The patient does not have symptoms concerning for COVID-19 infection (fever, chills, cough, or new shortness of breath).   REVIEWED OF SYSTEMS  Review of Systems  Constitutional: Negative for malaise/fatigue (just deconditioned - lack of exercise) and weight loss.  HENT: Negative for congestion and nosebleeds.   Respiratory: Negative for cough and shortness of breath.   Cardiovascular: Negative for claudication.  Gastrointestinal: Negative for blood in stool and melena.  Genitourinary: Negative for hematuria.  Musculoskeletal: Positive for joint pain. Negative for back pain.   Neurological: Negative for dizziness and headaches.  Psychiatric/Behavioral: Negative for depression and memory loss. The patient has insomnia (whe  work issues are pressing;  takes nuvigil during day for sleepiness (drives a lot for work)).    I have reviewed and (if needed) personally updated the patient's problem list, medications, allergies, past medical and surgical history, social and family history.   PAST MEDICAL HISTORY   Past Medical History:  Diagnosis Date  . Diet-controlled hyperlipidemia   . Dysplastic nevus of trunk 08/2016   Mid back  . GERD (gastroesophageal reflux disease)   . H/O seasonal allergies   . Hemorrhoids    With related constipation  . Hypogonadism in male   . OSA on CPAP    Concerns for possible narcolepsy  . Psoriasis    Followed by Dr. Ronnald Ramp    PAST SURGICAL HISTORY   Past Surgical History:  Procedure Laterality Date  . KNEE SURGERY    . NASAL SINUS SURGERY     Uvulopalatopharyngoplasty  . SHOULDER SURGERY Left 08/2016  . TONSILLECTOMY    . VASECTOMY      There is no immunization history on file for this patient.  MEDICATIONS/ALLERGIES   Current Meds  Medication Sig  . allopurinol (ZYLOPRIM) 100 MG tablet Take 1 tablet by mouth daily.  . ASPIRIN 81 PO Take 81 mg daily by mouth.  . cetirizine (ZYRTEC) 10 MG tablet Take 10 mg daily by mouth.  . ezetimibe (ZETIA) 10 MG tablet TAKE 1 TABLET DAILY  . rosuvastatin (CRESTOR) 10 MG tablet TAKE 1 TABLET DAILY  . tamsulosin (FLOMAX) 0.4 MG CAPS capsule Take 1 capsule by mouth daily.    No Known Allergies  SOCIAL HISTORY/FAMILY HISTORY   Reviewed in Epic:  Pertinent findings:  Social History   Tobacco Use  . Smoking status: Former Smoker    Types: Cigarettes    Quit date: 1995    Years since quitting: 27.1  . Smokeless tobacco: Never Used  . Tobacco comment: quit 1995  Substance Use Topics  . Alcohol use: Yes    Alcohol/week: 4.0 standard drinks    Types: 4 Cans of beer per  week    Comment: 3-5 drinks of beer or liquor  . Drug use: No   Social History   Social History Narrative   Married father of 1 daughter.  He lives with his wife and daughter.      OBJCTIVE -PE, EKG, labs   Wt Readings from Last 3 Encounters:  09/15/20 230 lb (104.3 kg)  08/13/19 229 lb 12.8 oz (104.2 kg)  08/22/17 219 lb 6.4 oz (99.5 kg)    Physical Exam: BP 112/80   Pulse (!) 47   Ht 5\' 10"  (1.778 m)   Wt 230 lb (104.3 kg)   SpO2 97%   BMI 33.00 kg/m  Physical Exam Vitals reviewed.  Constitutional:      General: He is not in acute distress.    Appearance: Normal appearance. He is obese. He is not ill-appearing or toxic-appearing.     Comments: Well-groomed.  Healthy-appearing.  HENT:     Head: Normocephalic  and atraumatic.  Neck:     Vascular: No carotid bruit or JVD.  Cardiovascular:     Rate and Rhythm: Normal rate and regular rhythm.  No extrasystoles are present.    Chest Wall: PMI is not displaced.     Pulses: Normal pulses.     Heart sounds: Normal heart sounds. No murmur heard. No friction rub. No gallop.   Pulmonary:     Effort: Pulmonary effort is normal. No respiratory distress.     Breath sounds: Normal breath sounds.  Chest:     Chest wall: No tenderness.  Musculoskeletal:        General: No swelling. Normal range of motion.     Cervical back: Normal range of motion and neck supple.  Neurological:     General: No focal deficit present.     Mental Status: He is oriented to person, place, and time. Mental status is at baseline.     Motor: No weakness.  Psychiatric:        Mood and Affect: Mood normal.        Behavior: Behavior normal.        Thought Content: Thought content normal.        Judgment: Judgment normal.     Adult ECG Report  Rate: 47;  Rhythm: sinus bradycardia; normal axis, intervals and durations.  Narrative Interpretation: Borderline EKG.  Stable  Recent Labs: May 24, 2020: TC 117, TG 116, HDL 38, LDL 58.  Hgb 15.9; Cr  1.11, K+ 4.3.  TSH 1.79.  ALT 117 No results found for: CHOL, HDL, LDLCALC, LDLDIRECT, TRIG, CHOLHDL No results found for: CREATININE, BUN, NA, K, CL, CO2 CBC Latest Ref Rng & Units 02/05/2011  WBC 4.0 - 10.5 K/uL 4.3  Hemoglobin 13.0 - 17.0 g/dL 15.3  Hematocrit 39.0 - 52.0 % 43.3  Platelets 150 - 400 K/uL 188    No results found for: TSH  ==================================================  COVID-19 Education: The signs and symptoms of COVID-19 were discussed with the patient and how to seek care for testing (follow up with PCP or arrange E-visit).   The importance of social distancing and COVID-19 vaccination was discussed today.   64min-> was happy to have had his 2 vaccines prior to getting infected back in November.  Had a relatively brief case of Covid.  Is pending his booster shot in March. The patient is practicing social distancing & Masking.   I spent a total of 66minutes with the patient spent in direct patient consultation.  Additional time spent with chart review  / charting (studies, outside notes, etc): 9 min Total Time: 31 min   Current medicines are reviewed at length with the patient today.  (+/- concerns) none  This visit occurred during the SARS-CoV-2 public health emergency.  Safety protocols were in place, including screening questions prior to the visit, additional usage of staff PPE, and extensive cleaning of exam room while observing appropriate contact time as indicated for disinfecting solutions.  Notice: This dictation was prepared with Dragon dictation along with smaller phrase technology. Any transcriptional errors that result from this process are unintentional and may not be corrected upon review.  Patient Instructions / Medication Changes & Studies & Tests Ordered   Patient Instructions  Medication Instructions:   No changes *If you need a refill on your cardiac medications before your next appointment, please call your pharmacy*   Lab  Work:  Not needed   Testing/Procedures:  Not needed  Follow-Up: At [facility], you  and your health needs are our priority.  As part of our continuing mission to provide you with exceptional heart care, we have created designated Provider Care Teams.  These Care Teams include your primary Cardiologist (physician) and Advanced Practice Providers (APPs -  Physician Assistants and Nurse Practitioners) who all work together to provide you with the care you need, when you need it.     Your next appointment:   12 month(s)  The format for your next appointment:   In Person  Provider:   Glenetta Hew, MD     Studies Ordered:   No orders of the defined types were placed in this encounter.    Lance Walker, M.D., M.S. Interventional Cardiologist   Pager # [phone] Phone # [phone] [address] [place], [place] [address]   Thank you for choosing Heartcare at [facility]!!

## 2020-09-15 NOTE — Assessment & Plan Note (Signed)
Blood pressure looks great today also.  No meds

## 2020-09-15 NOTE — Assessment & Plan Note (Signed)
CRFs include obesity and dyslipidemia, but no prediabetes or hypertension.  No active symptoms.  No need for screening test based on the fact that he is adequately controlled right now as was instructed to go.  Only needs to work on weight loss.

## 2020-09-15 NOTE — Addendum Note (Signed)
Addended by: Raiford Simmonds on: 09/15/2020 09:37 AM   Modules accepted: Orders

## 2020-09-15 NOTE — Assessment & Plan Note (Signed)
Unfortunately, he got out of the habit of doing exercise during the initial outbreak of Covid.  Now lacks motivation to get back into the exercise regimen.  We talked about making sure that he sets that time to exercise.  He has a very busy work life schedule with frequent travels.  He needs to monitor his diet

## 2020-09-15 NOTE — Patient Instructions (Signed)
Medication Instructions:  No changes *If you need a refill on your cardiac medications before your next appointment, please call your pharmacy*   Lab Work: Not needed   Testing/Procedures:  Not needed Follow-Up: At [facility], you and your health needs are our priority.  As part of our continuing mission to provide you with exceptional heart care, we have created designated Provider Care Teams.  These Care Teams include your primary Cardiologist (physician) and Advanced Practice Providers (APPs -  Physician Assistants and Nurse Practitioners) who all work together to provide you with the care you need, when you need it.     Your next appointment:   12 month(s)  The format for your next appointment:   In Person  Provider:   Meshulem Harding, MD    

## 2021-06-08 NOTE — Progress Notes (Signed)
Cardiology Office Note:    Date:  06/11/2021   ID:  [name], DOB 1966/02/03, MRN [id number]  PCP:  Shirline Frees, MD  Cardiologist:  Glenetta Hew, MD  Electrophysiologist:  None   Referring MD: Caren Macadam, MD   Chief Complaint: shortness of breath  History of Present Illness:    Lance Walker is a 55 y.o. male with a history of borderline hypertension, hyperlipidemia, GERD, obstructive sleep apnea on CPAP, and obesity who is followed by Dr. Ellyn Hack and presents today for further evaluation of shortness of breath.  Patient was referred to Dr. Ellyn Hack in 05/2017 for management of cardiovascular risk factors. Coronary calcium score was ordered and was 0. Patient was last seen by Dr. Ellyn Hack in 08/2020 at which time he was doing well. He did report some exertional dyspnea which was felt to be due to deconditioning. He had gained weight and not been motivated to exercise after all the gyms closed down during the pandemic.  Patient presents today for further evaluation of shortness of breath.  Here alone. Patient reports continued dyspnea on exertion. He reports he did have COVID about 1 year ago and then he came down with RSV 3 weeks ago but has mostly recovered from this.  He states he started going back to the gym 4 months ago and working with a Water engineer 3 times a week and he states he gets "really winded" with this and "just cannot get his breath."  He states he feels like he "cannot recover."  He thought his shortness of breath would have improved by now since he has been going to the gym for 4 months but it has not.  He denies any chest pain.  No shortness of breath at rest, orthopnea, PND, lower extremity edema.  He notes occasional heart racing usually at night or at rest but this is not new.  These palpitations can last for several minutes and then will resolve on their own but occurs only rarely (once in the past 2 to 3 months).  He denies any  lightheadedness/dizziness or syncope. No abnormal bleeding.  Past Medical History:  Diagnosis Date   Dysplastic nevus of trunk 08/2016   Mid back   GERD (gastroesophageal reflux disease)    Hemorrhoids    With related constipation   Hyperlipidemia    Hypogonadism in male    OSA on CPAP    Concerns for possible narcolepsy   Psoriasis    Followed by Dr. Ronnald Ramp   Seasonal allergies     Past Surgical History:  Procedure Laterality Date   KNEE SURGERY     NASAL SINUS SURGERY     Uvulopalatopharyngoplasty   SHOULDER SURGERY Left 08/2016   TONSILLECTOMY     VASECTOMY      Current Medications: Current Meds  Medication Sig   allopurinol (ZYLOPRIM) 100 MG tablet Take 1 tablet by mouth every other day.   cetirizine (ZYRTEC) 10 MG tablet Take 10 mg daily by mouth.   ezetimibe (ZETIA) 10 MG tablet TAKE 1 TABLET DAILY   rosuvastatin (CRESTOR) 10 MG tablet TAKE 1 TABLET DAILY   tamsulosin (FLOMAX) 0.4 MG CAPS capsule Take 1 capsule by mouth daily.   [DISCONTINUED] ASPIRIN 81 PO Take 81 mg daily by mouth.     Allergies:   Patient has no known allergies.   Social History   Socioeconomic History   Marital status: Married    Spouse name: Not on file   Number of children: Not  on file   Years of education: 14   Highest education level: Some college, no degree  Occupational History   Occupation: Best boy: [employer]  Tobacco Use   Smoking status: Former    Types: Cigarettes    Quit date: 1995    Years since quitting: 27.9   Smokeless tobacco: Never   Tobacco comments:    quit 1995  Substance and Sexual Activity   Alcohol use: Yes    Alcohol/week: 4.0 standard drinks    Types: 4 Cans of beer per week    Comment: 3-5 drinks of beer or liquor   Drug use: No   Sexual activity: Not on file  Other Topics Concern   Not on file  Social History Narrative   Married father of 1 daughter.  He lives with his wife and daughter.     Social Determinants of  Health   Financial Resource Strain: Not on file  Food Insecurity: Not on file  Transportation Needs: Not on file  Physical Activity: Not on file  Stress: Not on file  Social Connections: Not on file     Family History: The patient's family history includes Alcohol abuse in his father; Breast cancer in his mother; Heart disease in his maternal grandfather; Hypertension in his maternal grandfather.  ROS:   Please see the history of present illness.     EKGs/Labs/Other Studies Reviewed:    The following studies were reviewed today:  CT Cardiac Scoring 06/19/2017: Impression: Coronary calcium score of 0. This was 0 percentile for age and sex matched control.  EKG:  EKG ordered today. EKG personally reviewed and demonstrates sinus bradycardia, rate 54 bpm, with no acute ST/T changes. Normal axis. Normal PR and QRS intervals. QTc 394 ms.  Recent Labs: No results found for requested labs within last 8760 hours.  Recent Lipid Panel No results found for: CHOL, TRIG, HDL, CHOLHDL, VLDL, LDLCALC, LDLDIRECT  Physical Exam:    Vital Signs: BP 132/78   Ht 5\' 10"  (1.778 m)   Wt 233 lb (105.7 kg)   SpO2 98%   BMI 33.43 kg/m     Wt Readings from Last 3 Encounters:  06/11/21 233 lb (105.7 kg)  09/15/20 230 lb (104.3 kg)  08/13/19 229 lb 12.8 oz (104.2 kg)     General: 55 y.o. Caucasian male in no acute distress. HEENT: Normocephalic and atraumatic. Sclera clear. EOMs intact. Neck: Supple. No JVD. Heart: Mildly bradycardic with regular rhythm. Distinct S1 and S2. No murmurs, gallops, or rubs. Radial pulses 2+ and equal bilaterally. Lungs: No increased work of breathing. Clear to ausculation bilaterally. No wheezes, rhonchi, or rales.  Abdomen: Soft, non-distended, and non-tender to palpation.  MSK: Normal strength and tone for age.  Extremities: No lower extremity edema.    Skin: Warm and dry. Neuro: Alert and oriented x3. No focal deficits. Psych: Normal affect. Responds  appropriately.  Assessment:    1. Dyspnea on exertion   2. Borderline hypertension   3. Hyperlipidemia, unspecified hyperlipidemia type   4. OSA on CPAP     Plan:    Dyspnea on Exertion Patient continues to report dyspnea on exertion. Previously felt to be due to deconditioning. However, he started going to the gym 4 months ago and working with a Physiological scientist and denies any improvement. Coronary calcium score 0 in 2018.  - Appears euvolemic on exam.  - No other symptoms of CHF. However, will check BNP and Echo. If Echo normal,  could consider coronary CTA to rule our CAD. Of note, he is currently taking Aspirin 81mg  daily. Given coronary calcium score of 0 in the past, advised that he can stop this.  Borderline Hypertension BP borderline elevated today at 132/78. Not on any medications. Will continue to monitor and can discuss more at follow-up visit.  Hyperlipidemia Recent lipid panel from 06/01/2021 (per [facility]): Total Cholesterol 106, Triglycerides 105, HDL 29, LDL 57. - Continue Crestor 10mg  daily and Zetia 10mg  daily.  - Of note, he has history of elevated LFTs which is why he is not on a higher dose statin. Labs followed by PCP.  Obstructive Sleep Apnea - Continue CPAP.  Disposition: Follow up in 2 months after Echo.   Medication Adjustments/Labs and Tests Ordered: Current medicines are reviewed at length with the patient today.  Concerns regarding medicines are outlined above.  Orders Placed This Encounter  Procedures   Brain natriuretic peptide   EKG 12-Lead   ECHOCARDIOGRAM COMPLETE    No orders of the defined types were placed in this encounter.   Patient Instructions  Medication Instructions:  STOP Aspirin   *If you need a refill on your cardiac medications before your next appointment, please call your pharmacy*  Lab Work: Your physician recommends that you return for lab work TODAY:  BNP  If you have labs (blood work) drawn today and your tests are  completely normal, you will receive your results only by: [facility] (if you have MyChart) OR A paper copy in the mail If you have any lab test that is abnormal or we need to change your treatment, we will call you to review the results.  Testing/Procedures: Your physician has requested that you have an echocardiogram. Echocardiography is a painless test that uses sound waves to create images of your heart. It provides your doctor with information about the size and shape of your heart and how well your heart's chambers and valves are working. This procedure takes approximately one hour. There are no restrictions for this procedure.  Please schedule for 1-2 months   Follow-Up: At [facility], you and your health needs are our priority.  As part of our continuing mission to provide you with exceptional heart care, we have created designated Provider Care Teams.  These Care Teams include your primary Cardiologist (physician) and Advanced Practice Providers (APPs -  Physician Assistants and Nurse Practitioners) who all work together to provide you with the care you need, when you need it.  Your next appointment:   2 month(s)  The format for your next appointment:   In Person  Provider:   Sande Rives, PA-C    Other Instructions    Signed, Lance Mclean, PA-C  06/11/2021 8:29 AM    Lance Walker

## 2021-06-09 ENCOUNTER — Encounter: Payer: Self-pay | Admitting: Student

## 2021-06-09 DIAGNOSIS — K219 Gastro-esophageal reflux disease without esophagitis: Secondary | ICD-10-CM | POA: Insufficient documentation

## 2021-06-09 DIAGNOSIS — G4733 Obstructive sleep apnea (adult) (pediatric): Secondary | ICD-10-CM | POA: Insufficient documentation

## 2021-06-11 ENCOUNTER — Encounter: Payer: Self-pay | Admitting: Student

## 2021-06-11 ENCOUNTER — Ambulatory Visit (INDEPENDENT_AMBULATORY_CARE_PROVIDER_SITE_OTHER): Payer: Commercial Managed Care - PPO | Admitting: Student

## 2021-06-11 ENCOUNTER — Other Ambulatory Visit: Payer: Self-pay

## 2021-06-11 VITALS — BP 132/78 | Ht 70.0 in | Wt 233.0 lb

## 2021-06-11 DIAGNOSIS — G4733 Obstructive sleep apnea (adult) (pediatric): Secondary | ICD-10-CM

## 2021-06-11 DIAGNOSIS — R030 Elevated blood-pressure reading, without diagnosis of hypertension: Secondary | ICD-10-CM | POA: Diagnosis not present

## 2021-06-11 DIAGNOSIS — R0609 Other forms of dyspnea: Secondary | ICD-10-CM | POA: Diagnosis not present

## 2021-06-11 DIAGNOSIS — E785 Hyperlipidemia, unspecified: Secondary | ICD-10-CM

## 2021-06-11 DIAGNOSIS — Z9989 Dependence on other enabling machines and devices: Secondary | ICD-10-CM

## 2021-06-11 DIAGNOSIS — K219 Gastro-esophageal reflux disease without esophagitis: Secondary | ICD-10-CM

## 2021-06-11 NOTE — Patient Instructions (Addendum)
Medication Instructions:  STOP Aspirin   *If you need a refill on your cardiac medications before your next appointment, please call your pharmacy*  Lab Work: Your physician recommends that you return for lab work TODAY:  BNP  If you have labs (blood work) drawn today and your tests are completely normal, you will receive your results only by: [facility] (if you have MyChart) OR A paper copy in the mail If you have any lab test that is abnormal or we need to change your treatment, we will call you to review the results.  Testing/Procedures: Your physician has requested that you have an echocardiogram. Echocardiography is a painless test that uses sound waves to create images of your heart. It provides your doctor with information about the size and shape of your heart and how well your heart's chambers and valves are working. This procedure takes approximately one hour. There are no restrictions for this procedure.  Please schedule for 1-2 months   Follow-Up: At [facility], you and your health needs are our priority.  As part of our continuing mission to provide you with exceptional heart care, we have created designated Provider Care Teams.  These Care Teams include your primary Cardiologist (physician) and Advanced Practice Providers (APPs -  Physician Assistants and Nurse Practitioners) who all work together to provide you with the care you need, when you need it.  Your next appointment:   2 month(s)  The format for your next appointment:   In Person  Provider:   Sande Rives, PA-C    Other Instructions

## 2021-06-12 LAB — BRAIN NATRIURETIC PEPTIDE: BNP: <2.5 pg/mL (ref 0.0–100.0)

## 2021-07-10 ENCOUNTER — Other Ambulatory Visit: Payer: Self-pay | Admitting: Cardiology

## 2021-08-09 NOTE — Progress Notes (Signed)
Cardiology Office Note:    Date:  08/20/2021   ID:  POWELL HALBERT, DOB October 27, 1965, MRN [id number]  PCP:  Shirline Frees, MD  Cardiologist:  Glenetta Hew, MD  Electrophysiologist:  None   Referring MD: Shirline Frees, MD   Chief Complaint: follow-up of dyspnea on exertion  History of Present Illness:    Lance Walker is a 56 y.o. male with a history of borderline hypertension, hyperlipidemia, GERD, obstructive sleep apnea on CPAP, and obesity who is followed by Dr. Ellyn Hack and presents today for follow-up of dyspnea on exertion.   Patient was referred to Dr. Ellyn Hack in 05/2017 for management of cardiovascular risk factors. Coronary calcium score was ordered and was 0. Patient was last seen by Dr. Ellyn Hack in 08/2020 at which time he was doing well. He did report some exertional dyspnea which was felt to be due to deconditioning. He had gained weight and not been motivated to exercise after all the gyms closed down during the pandemic.   Patient was last seen by me in 05/2021 at which time he continued to report dyspnea on exertion despite becoming more active and working with a Physiological scientist but denied any chest pain. BNP was ordered and was negative. Echo was ordered and showed LVEF of 55-60% with normal wall motion, normal diastolic function, and no significant valvular disease.  Patient presents today for follow-up. Here alone. Patient reports slight improvement in shortness of breath since he began working with a Physiological scientist 3 months ago but no real improvement since his last visit. He states that before COVID, he was able to work out much harder and recovered much quicker than he is now. He has had COVID twice (last in 10/2020) and then he had RSV in October/November 2022. He denies any chest pain, shortness of breath at rest, orthopnea, PND, or lower extremity edema. He notes occasional palpitations at rest that he describes as heart racing. This can last for up to 30 minutes  at time but occurs rarely (about 2-3 times per year). No lightheadedness, dizziness, near syncope/syncope with this.  Past Medical History:  Diagnosis Date   Dysplastic nevus of trunk 08/2016   Mid back   GERD (gastroesophageal reflux disease)    Hemorrhoids    With related constipation   Hyperlipidemia    Hypogonadism in male    OSA on CPAP    Concerns for possible narcolepsy   Psoriasis    Followed by Dr. Ronnald Ramp   Seasonal allergies     Past Surgical History:  Procedure Laterality Date   KNEE SURGERY     NASAL SINUS SURGERY     Uvulopalatopharyngoplasty   SHOULDER SURGERY Left 08/2016   TONSILLECTOMY     VASECTOMY      Current Medications: Current Meds  Medication Sig   cetirizine (ZYRTEC) 10 MG tablet Take 10 mg daily by mouth.   ezetimibe (ZETIA) 10 MG tablet TAKE 1 TABLET DAILY   metoprolol tartrate (LOPRESSOR) 100 MG tablet Take 1 tablet 1 1/2-2 hours prior to procedure.   rosuvastatin (CRESTOR) 10 MG tablet TAKE 1 TABLET DAILY   tamsulosin (FLOMAX) 0.4 MG CAPS capsule Take 1 capsule by mouth daily.     Allergies:   Patient has no known allergies.   Social History   Socioeconomic History   Marital status: Married    Spouse name: Not on file   Number of children: Not on file   Years of education: 14   Highest education level: Some  college, no degree  Occupational History   Occupation: Best boy: [employer]  Tobacco Use   Smoking status: Former    Types: Cigarettes    Quit date: 1995    Years since quitting: 28.0   Smokeless tobacco: Never   Tobacco comments:    quit 1995  Substance and Sexual Activity   Alcohol use: Yes    Alcohol/week: 4.0 standard drinks    Types: 4 Cans of beer per week    Comment: 3-5 drinks of beer or liquor   Drug use: No   Sexual activity: Not on file  Other Topics Concern   Not on file  Social History Narrative   Married father of 1 daughter.  He lives with his wife and daughter.     Social  Determinants of Health   Financial Resource Strain: Not on file  Food Insecurity: Not on file  Transportation Needs: Not on file  Physical Activity: Not on file  Stress: Not on file  Social Connections: Not on file     Family History: The patient's family history includes Alcohol abuse in his father; Breast cancer in his mother; Heart disease in his maternal grandfather; Hypertension in his maternal grandfather.  ROS:   Please see the history of present illness.     EKGs/Labs/Other Studies Reviewed:    The following studies were reviewed today:  Echocardiogram 08/13/2021: Impressions:  1. Global longitudinal strain is -23.5%. Left ventricular ejection  fraction, by estimation, is 55 to 60%. The left ventricle has normal  function. The left ventricle has no regional wall motion abnormalities.  Left ventricular diastolic parameters were  normal.   2. Right ventricular systolic function is low normal. The right  ventricular size is normal.   3. The mitral valve is normal in structure. Trivial mitral valve  regurgitation.   4. The aortic valve is normal in structure. Aortic valve regurgitation is  not visualized.   5. The inferior vena cava is normal in size with greater than 50%  respiratory variability, suggesting right atrial pressure of 3 mmHg.   EKG:  EKG not ordered today.   Recent Labs: 06/11/2021: BNP <2.5  Recent Lipid Panel No results found for: CHOL, TRIG, HDL, CHOLHDL, VLDL, LDLCALC, LDLDIRECT  Physical Exam:    Vital Signs: BP 118/72    Pulse 62    Ht 5\' 10"  (1.778 m)    Wt 241 lb 6.4 oz (109.5 kg)    SpO2 97%    BMI 34.64 kg/m     Wt Readings from Last 3 Encounters:  08/20/21 241 lb 6.4 oz (109.5 kg)  06/11/21 233 lb (105.7 kg)  09/15/20 230 lb (104.3 kg)     General: 56 y.o. Caucasian male in no acute distress. HEENT: Normocephalic and atraumatic. Sclera clear.  Neck: Supple. No carotid bruits. No JVD. Heart: RRR. Distinct S1 and S2. No murmurs,  gallops, or rubs. Radial pulses 2+ and equal bilaterally. Lungs: No increased work of breathing. Clear to ausculation bilaterally. No wheezes, rhonchi, or rales.  Abdomen: Soft, non-distended, and non-tender to palpation.  MSK: Normal strength and tone for age.  Extremities: No lower extremity edema.    Skin: Warm and dry. Neuro: Alert and oriented x3. No focal deficits. Psych: Normal affect. Responds appropriately.  Assessment:    1. Dyspnea on exertion   2. Palpitations   3. History of borderline hypertension   4. Hyperlipidemia, unspecified hyperlipidemia type   5. Obstructive sleep apnea  Plan:    Dyspnea on Exertion Patient continues to report dyspnea on exertion. Previously felt to be due to deconditioning. However, has persisted with increased activity and working with a Physiological scientist. Coronary calcium score 0 in 2018. Recent Echo on 08/13/2021 showed 55-60% with normal wall motion, normal diastolic function, and no significant valvular disease. - Appears euvolemic on exam. No other symptoms of CHF.  - Dyspnea may still be due to deconditioning and weight gain as well as previous COVID infections and RSV; however, given symptoms have persisted for about 1 year, will get coronary CTA to rule out CAD and dyspnea as an anginal equivalent. Will provider Lopressor for patient to take prior to scan to get heart rate to target range.  Palpitations Patient reports very rare palpitations that he describes as heart racing. Can last up to 30 minutes at a time but occurs rarely (only 2-3 times per year). No associated symptoms. - Heart rates in the low 60s today. - Given palpitations occur rarely, will hold off on ordering monitor at this time. Advised patient to let us know if palpitations worsen or become more frequent.   History of Borderline Hypertension BP well controlled. Not on any medications.    Hyperlipidemia Recent lipid panel from 06/01/2021 (per [facility]): Total Cholesterol  106, Triglycerides 105, HDL 29, LDL 57. - Continue Crestor 10mg  daily and Zetia 10mg  daily.  - Of note, he has history of elevated LFTs which is why he is not on a higher dose statin. Labs followed by PCP.   Obstructive Sleep Apnea - Continue CPAP.  Disposition: Follow up in 6 months.   Medication Adjustments/Labs and Tests Ordered: Current medicines are reviewed at length with the patient today.  Concerns regarding medicines are outlined above.  Orders Placed This Encounter  Procedures   CT CORONARY MORPH W/CTA COR W/SCORE W/CA W/CM &/OR WO/CM   Basic metabolic panel   Meds ordered this encounter  Medications   metoprolol tartrate (LOPRESSOR) 100 MG tablet    Sig: Take 1 tablet 1 1/2-2 hours prior to procedure.    Dispense:  1 tablet    Refill:  0    Patient Instructions  Medication Instructions:  Your physician recommends that you continue on your current medications as directed. Please refer to the Current Medication list given to you today.   *If you need a refill on your cardiac medications before your next appointment, please call your pharmacy*   Lab Work: Your physician recommends that you return for lab work 1 week before Coronary CT BMET If you have labs (blood work) drawn today and your tests are completely normal, you will receive your results only by: [place] (if you have MyChart) OR A paper copy in the mail If you have any lab test that is abnormal or we need to change your treatment, we will call you to review the results.   Testing/Procedures: Your physician has requested that you have cardiac CT. Cardiac computed tomography (CT) is a painless test that uses an x-ray machine to take clear, detailed pictures of your heart. For further information please visit [email or web address].[email or web address]. Please follow instruction sheet as given.     Follow-Up: At [facility], you and your health needs are our priority.  As part of our continuing mission to provide you  with exceptional heart care, we have created designated Provider Care Teams.  These Care Teams include your primary Cardiologist (physician) and Advanced Practice Providers (APPs -  Physician Assistants and Nurse Practitioners)  who all work together to provide you with the care you need, when you need it.  We recommend signing up for the patient portal called "MyChart".  Sign up information is provided on this After Visit Summary.  MyChart is used to connect with patients for Virtual Visits (Telemedicine).  Patients are able to view lab/test results, encounter notes, upcoming appointments, etc.  Non-urgent messages can be sent to your provider as well.   To learn more about what you can do with MyChart, go to [email or web address].    Your next appointment:   6 month(s)  The format for your next appointment:   In Person  Provider:   Glenetta Hew, MD  or Sande Rives, PA-C        Other Instructions   Your cardiac CT will be scheduled at one of the below locations:   [facility] [address] [place], [place] [address] [phone]  [facility] [address] [place], [place] [address] [phone]  If scheduled at [facility], please arrive at the [facility] main entrance (entrance A) of [facility] 30 minutes prior to test start time. You can use the FREE valet parking offered at the main entrance (encouraged to control the heart rate for the test) Proceed to the [facility] Radiology Department (first floor) to check-in and test prep.  If scheduled at [facility], please arrive 15 mins early for check-in and test prep.  Please follow these instructions carefully (unless otherwise directed):  Hold all erectile dysfunction medications at least 3 days (72 hrs) prior to test.  On the Night Before the Test: Be sure to Drink plenty of water. Do not consume any  caffeinated/decaffeinated beverages or chocolate 12 hours prior to your test. Do not take any antihistamines 12 hours prior to your test. If the patient has contrast allergy: Patient will need a prescription for Prednisone and very clear instructions (as follows): Prednisone 50 mg - take 13 hours prior to test Take another Prednisone 50 mg 7 hours prior to test Take another Prednisone 50 mg 1 hour prior to test Take Benadryl 50 mg 1 hour prior to test Patient must complete all four doses of above prophylactic medications. Patient will need a ride after test due to Benadryl.  On the Day of the Test: Drink plenty of water until 1 hour prior to the test. Do not eat any food 4 hours prior to the test. You may take your regular medications prior to the test.  Take metoprolol (Lopressor) two hours prior to test. HOLD Furosemide/Hydrochlorothiazide morning of the test. FEMALES- please wear underwire-free bra if available, avoid dresses & tight clothing   *For Clinical Staff only. Please instruct patient the following:* Heart Rate Medication Recommendations for Cardiac CT  Resting HR < 50 bpm  No medication  Resting HR 50-60 bpm and BP >110/50 mmHG   Consider Metoprolol tartrate 25 mg PO 90-120 min prior to scan  Resting HR 60-65 bpm and BP >110/50 mmHG  Metoprolol tartrate 50 mg PO 90-120 minutes prior to scan   Resting HR > 65 bpm and BP >110/50 mmHG  Metoprolol tartrate 100 mg PO 90-120 minutes prior to scan  Consider Ivabradine 10-15 mg PO or a calcium channel blocker for resting HR >60 bpm and contraindication to metoprolol tartrate  Consider Ivabradine 10-15 mg PO in combination with metoprolol tartrate for HR >80 bpm  After the Test: Drink plenty of water. After receiving IV contrast, you may experience a mild flushed feeling. This is normal. On occasion, you may experience a mild rash up to 24 hours after the test. This is not dangerous. If this occurs, you can take  Benadryl 25 mg and increase your fluid intake. If you experience trouble breathing, this can be serious. If it is severe call 911 IMMEDIATELY. If it is mild, please call our office. If you take any of these medications: Glipizide/Metformin, Avandament, Glucavance, please do not take 48 hours after completing test unless otherwise instructed.  We will call to schedule your test 2-4 weeks out understanding that some insurance companies will need an authorization prior to the service being performed.   For non-scheduling related questions, please contact the cardiac imaging nurse navigator should you have any questions/concerns: Marchia Bond, Cardiac Imaging Nurse Navigator Gordy Clement, Cardiac Imaging Nurse Navigator [facility] Direct Office Dial: [phone]   For scheduling needs, including cancellations and rescheduling, please call [place], [phone].    Signed, Darreld Mclean, PA-C  08/20/2021 11:14 AM    [place]

## 2021-08-13 ENCOUNTER — Ambulatory Visit (HOSPITAL_COMMUNITY): Payer: Commercial Managed Care - PPO | Attending: Internal Medicine

## 2021-08-13 ENCOUNTER — Other Ambulatory Visit: Payer: Self-pay

## 2021-08-13 DIAGNOSIS — R0609 Other forms of dyspnea: Secondary | ICD-10-CM

## 2021-08-13 HISTORY — PX: TRANSTHORACIC ECHOCARDIOGRAM: SHX275

## 2021-08-13 LAB — ECHOCARDIOGRAM COMPLETE
Area-P 1/2: 3.08 cm2
S' Lateral: 3.7 cm

## 2021-08-20 ENCOUNTER — Ambulatory Visit (INDEPENDENT_AMBULATORY_CARE_PROVIDER_SITE_OTHER): Payer: Commercial Managed Care - PPO | Admitting: Student

## 2021-08-20 ENCOUNTER — Other Ambulatory Visit: Payer: Self-pay

## 2021-08-20 ENCOUNTER — Encounter: Payer: Self-pay | Admitting: Student

## 2021-08-20 VITALS — BP 118/72 | HR 62 | Ht 70.0 in | Wt 241.4 lb

## 2021-08-20 DIAGNOSIS — R002 Palpitations: Secondary | ICD-10-CM

## 2021-08-20 DIAGNOSIS — G4733 Obstructive sleep apnea (adult) (pediatric): Secondary | ICD-10-CM

## 2021-08-20 DIAGNOSIS — R0609 Other forms of dyspnea: Secondary | ICD-10-CM | POA: Diagnosis not present

## 2021-08-20 DIAGNOSIS — E785 Hyperlipidemia, unspecified: Secondary | ICD-10-CM

## 2021-08-20 DIAGNOSIS — R030 Elevated blood-pressure reading, without diagnosis of hypertension: Secondary | ICD-10-CM | POA: Diagnosis not present

## 2021-08-20 MED ORDER — METOPROLOL TARTRATE 100 MG PO TABS: ORAL_TABLET | ORAL | 0 refills | Status: DC

## 2021-08-20 NOTE — Patient Instructions (Signed)
Medication Instructions:  Your physician recommends that you continue on your current medications as directed. Please refer to the Current Medication list given to you today.   *If you need a refill on your cardiac medications before your next appointment, please call your pharmacy*   Lab Work: Your physician recommends that you return for lab work 1 week before Coronary CT BMET If you have labs (blood work) drawn today and your tests are completely normal, you will receive your results only by: [place] (if you have MyChart) OR A paper copy in the mail If you have any lab test that is abnormal or we need to change your treatment, we will call you to review the results.   Testing/Procedures: Your physician has requested that you have cardiac CT. Cardiac computed tomography (CT) is a painless test that uses an x-ray machine to take clear, detailed pictures of your heart. For further information please visit HugeFiesta.[email or web address]. Please follow instruction sheet as given.     Follow-Up: At [facility], you and your health needs are our priority.  As part of our continuing mission to provide you with exceptional heart care, we have created designated Provider Care Teams.  These Care Teams include your primary Cardiologist (physician) and Advanced Practice Providers (APPs -  Physician Assistants and Nurse Practitioners) who all work together to provide you with the care you need, when you need it.  We recommend signing up for the patient portal called "MyChart".  Sign up information is provided on this After Visit Summary.  MyChart is used to connect with patients for Virtual Visits (Telemedicine).  Patients are able to view lab/test results, encounter notes, upcoming appointments, etc.  Non-urgent messages can be sent to your provider as well.   To learn more about what you can do with MyChart, go to [email or web address].    Your next appointment:   6 month(s)  The format for  your next appointment:   In Person  Provider:   Glenetta Hew, MD  or Sande Rives, PA-C        Other Instructions   Your cardiac CT will be scheduled at one of the below locations:   [facility] [address] [place], [place] [address] [phone]  [facility] [address] [place], [place] [address] [phone]  If scheduled at [facility], please arrive at the [facility] main entrance (entrance A) of [facility] 30 minutes prior to test start time. You can use the FREE valet parking offered at the main entrance (encouraged to control the heart rate for the test) Proceed to the [facility] Radiology Department (first floor) to check-in and test prep.  If scheduled at [facility], please arrive 15 mins early for check-in and test prep.  Please follow these instructions carefully (unless otherwise directed):  Hold all erectile dysfunction medications at least 3 days (72 hrs) prior to test.  On the Night Before the Test: Be sure to Drink plenty of water. Do not consume any caffeinated/decaffeinated beverages or chocolate 12 hours prior to your test. Do not take any antihistamines 12 hours prior to your test. If the patient has contrast allergy: Patient will need a prescription for Prednisone and very clear instructions (as follows): Prednisone 50 mg - take 13 hours prior to test Take another Prednisone 50 mg 7 hours prior to test Take another Prednisone 50 mg 1 hour prior to test Take Benadryl 50 mg  1 hour prior to test Patient must complete all four doses of above prophylactic medications. Patient will need a ride after test due to Benadryl.  On the Day of the Test: Drink plenty of water until 1 hour prior to the test. Do not eat any food 4 hours prior to the test. You may take your regular medications prior to the test.  Take metoprolol (Lopressor) two  hours prior to test. HOLD Furosemide/Hydrochlorothiazide morning of the test. FEMALES- please wear underwire-free bra if available, avoid dresses & tight clothing   *For Clinical Staff only. Please instruct patient the following:* Heart Rate Medication Recommendations for Cardiac CT  Resting HR < 50 bpm  No medication  Resting HR 50-60 bpm and BP >110/50 mmHG   Consider Metoprolol tartrate 25 mg PO 90-120 min prior to scan  Resting HR 60-65 bpm and BP >110/50 mmHG  Metoprolol tartrate 50 mg PO 90-120 minutes prior to scan   Resting HR > 65 bpm and BP >110/50 mmHG  Metoprolol tartrate 100 mg PO 90-120 minutes prior to scan  Consider Ivabradine 10-15 mg PO or a calcium channel blocker for resting HR >60 bpm and contraindication to metoprolol tartrate  Consider Ivabradine 10-15 mg PO in combination with metoprolol tartrate for HR >80 bpm         After the Test: Drink plenty of water. After receiving IV contrast, you may experience a mild flushed feeling. This is normal. On occasion, you may experience a mild rash up to 24 hours after the test. This is not dangerous. If this occurs, you can take Benadryl 25 mg and increase your fluid intake. If you experience trouble breathing, this can be serious. If it is severe call 911 IMMEDIATELY. If it is mild, please call our office. If you take any of these medications: Glipizide/Metformin, Avandament, Glucavance, please do not take 48 hours after completing test unless otherwise instructed.  We will call to schedule your test 2-4 weeks out understanding that some insurance companies will need an authorization prior to the service being performed.   For non-scheduling related questions, please contact the cardiac imaging nurse navigator should you have any questions/concerns: Marchia Bond, Cardiac Imaging Nurse Navigator Gordy Clement, Cardiac Imaging Nurse Navigator  [facility] Direct Office Dial: [phone]   For  scheduling needs, including cancellations and rescheduling, please call [place], [phone].

## 2021-08-27 ENCOUNTER — Other Ambulatory Visit: Payer: Commercial Managed Care - PPO

## 2021-08-27 ENCOUNTER — Other Ambulatory Visit: Payer: Self-pay

## 2021-08-28 ENCOUNTER — Telehealth (HOSPITAL_COMMUNITY): Payer: Self-pay | Admitting: Emergency Medicine

## 2021-08-28 LAB — BASIC METABOLIC PANEL
BUN/Creatinine Ratio: 12 (ref 9–20)
BUN: 13 mg/dL (ref 6–24)
CO2: 23 mmol/L (ref 20–29)
Calcium: 9.9 mg/dL (ref 8.7–10.2)
Chloride: 105 mmol/L (ref 96–106)
Creatinine, Ser: 1.09 mg/dL (ref 0.76–1.27)
Glucose: 92 mg/dL (ref 70–99)
Potassium: 4.4 mmol/L (ref 3.5–5.2)
Sodium: 142 mmol/L (ref 134–144)
eGFR: 81 mL/min/{1.73_m2} (ref >59)

## 2021-08-28 NOTE — Telephone Encounter (Signed)
Reaching out to patient to offer assistance regarding upcoming cardiac imaging study; pt verbalizes understanding of appt date/time, parking situation and where to check in, pre-test NPO status and medications ordered, and verified current allergies; name and call back number provided for further questions should they arise Lance Bond RN Navigator Cardiac Imaging [facility] [phone] office [phone] cell  Denies IV issues Holding 100mg  metoprolol tartrate since HR was 54-62 Arrival 830

## 2021-08-28 NOTE — Telephone Encounter (Signed)
Attempted to call patient regarding upcoming cardiac CT appointment. °Left message on voicemail with name and callback number °Lilliane Sposito RN Navigator Cardiac Imaging °[facility] °[phone] Office °[phone] Cell ° °

## 2021-08-30 ENCOUNTER — Other Ambulatory Visit: Payer: Self-pay

## 2021-08-30 ENCOUNTER — Ambulatory Visit (HOSPITAL_COMMUNITY)
Admission: RE | Admit: 2021-08-30 | Discharge: 2021-08-30 | Disposition: A | Discharge status: Home / Self Care | Payer: Commercial Managed Care - PPO | Source: Ambulatory Visit | Attending: Student | Admitting: Student

## 2021-08-30 ENCOUNTER — Encounter (HOSPITAL_COMMUNITY): Payer: Self-pay

## 2021-08-30 DIAGNOSIS — R0609 Other forms of dyspnea: Secondary | ICD-10-CM | POA: Insufficient documentation

## 2021-08-30 HISTORY — PX: CT CTA CORONARY W/CA SCORE W/CM &/OR WO/CM: HXRAD787

## 2021-08-30 MED ORDER — NITROGLYCERIN 0.4 MG SL SUBL
SUBLINGUAL_TABLET | SUBLINGUAL | Status: AC
  Filled 2021-08-30: qty 2

## 2021-08-30 MED ORDER — IOHEXOL 350 MG/ML SOLN
95.0000 mL | Freq: Once | INTRAVENOUS | Status: AC | PRN
  Administered 2021-08-30: 95 mL via INTRAVENOUS

## 2021-08-30 MED ORDER — NITROGLYCERIN 0.4 MG SL SUBL
0.8000 mg | SUBLINGUAL_TABLET | Freq: Once | SUBLINGUAL | Status: AC
  Administered 2021-08-30: 0.8 mg via SUBLINGUAL

## 2021-09-21 ENCOUNTER — Other Ambulatory Visit: Payer: Self-pay

## 2021-09-21 ENCOUNTER — Encounter: Payer: Self-pay | Admitting: Pulmonary Disease

## 2021-09-21 ENCOUNTER — Ambulatory Visit (INDEPENDENT_AMBULATORY_CARE_PROVIDER_SITE_OTHER): Payer: Commercial Managed Care - PPO | Admitting: Pulmonary Disease

## 2021-09-21 VITALS — BP 124/68 | HR 71 | Ht 70.0 in | Wt 231.0 lb

## 2021-09-21 DIAGNOSIS — J4520 Mild intermittent asthma, uncomplicated: Secondary | ICD-10-CM | POA: Diagnosis not present

## 2021-09-21 DIAGNOSIS — R0602 Shortness of breath: Secondary | ICD-10-CM

## 2021-09-21 MED ORDER — FLUTICASONE-SALMETEROL 115-21 MCG/ACT IN AERO: 2.0000 | INHALATION_SPRAY | Freq: Two times a day (BID) | RESPIRATORY_TRACT | 12 refills | Status: DC

## 2021-09-21 NOTE — Progress Notes (Signed)
Synopsis: Referred in March 2023 for shortness of breath by Lance Frees, MD  Subjective:   PATIENT ID: Lance Walker GENDER: male DOB: [birth year], MRN: [id number]  HPI  Chief Complaint  Patient presents with   Consult    Referred by PCP for DOE. Unable to recover as quickly after his workouts. Was diagnosed with COVID and RSV back in 2022. States he has been cleared by cardiology.    Lance Walker is a 56 year old male, former smoker with GERD, seasonal allergies and OSA on CPAP who is referred to pulmonary clinic for shortness of breath.   He had cardiology evaluation, note reviewed from 08/20/21, with normal echo. He had CT coronary scan on 08/30/21 with average calcium score for his age and no significant non-cardiac findings.  He had covid 11//2021 and 10/2020 with mild symptoms. He did receive bamlanivimab/etesevimab infusion 05/2020. He then had RSV in Fall 2022.   Before covid, he was on a good gym routine and weighed 210lbs. Once the gyms closed down he stopped working out. He currently weighs 231lbs. He started going to the gym 4 months ago and is working out with semi-private trainor 2-3 days per week. He feels he has plateaued and is not getting back to where he was. His strength is improving but he feels the cardio is not.  He has intermittent heartburn. He takes as needed tums. He has seasonal allergies and feels he has throat congestion. He is following up with ENT in the near future.   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 He has a 10 pack year smoking history. He quit in 1995. He reports second hand exposure from his parents in childhood.   Past Medical History:  Diagnosis Date   Dysplastic nevus of trunk 08/2016   Mid back   GERD (gastroesophageal reflux disease)    Hemorrhoids    With related constipation   Hyperlipidemia    Hypogonadism in male    OSA on CPAP    Concerns  for possible narcolepsy   Psoriasis    Followed by Dr. Ronnald Ramp   Seasonal allergies      Family History  Problem Relation Age of Onset   Breast cancer Mother    Alcohol abuse Father        Died - complication from alcohol use   Heart disease Maternal Grandfather    Hypertension Maternal Grandfather      Social History   Socioeconomic History   Marital status: Married    Spouse name: Not on file   Number of children: Not on file   Years of education: 14   Highest education level: Some college, no degree  Occupational History   Occupation: Best boy: [employer]  Tobacco Use   Smoking status: Former    Types: Cigarettes    Quit date: 1995    Years since quitting: 28.1   Smokeless tobacco: Never   Tobacco comments:    quit 1995  Substance and Sexual Activity   Alcohol use: Yes    Alcohol/week: 4.0 standard drinks    Types: 4 Cans of beer per week    Comment: 3-5 drinks of beer or liquor   Drug use: No   Sexual activity: Not on file  Other Topics Concern   Not on file  Social History Narrative   Married father of 1 daughter.  He lives  with his wife and daughter.     Social Determinants of Health   Financial Resource Strain: Not on file  Food Insecurity: Not on file  Transportation Needs: Not on file  Physical Activity: Not on file  Stress: Not on file  Social Connections: Not on file  Intimate Partner Violence: Not on file     No Known Allergies   Outpatient Medications Prior to Visit  Medication Sig Dispense Refill   cetirizine (ZYRTEC) 10 MG tablet Take 10 mg daily by mouth.     ezetimibe (ZETIA) 10 MG tablet TAKE 1 TABLET DAILY 90 tablet 3   rosuvastatin (CRESTOR) 10 MG tablet TAKE 1 TABLET DAILY 90 tablet 3   tamsulosin (FLOMAX) 0.4 MG CAPS capsule Take 1 capsule by mouth daily.     metoprolol tartrate (LOPRESSOR) 100 MG tablet Take 1 tablet 1 1/2-2 hours prior to procedure. (Patient not taking: Reported on 08/30/2021) 1 tablet 0    No facility-administered medications prior to visit.    Review of Systems  Constitutional:  Negative for chills, fever, malaise/fatigue and weight loss.  HENT:  Positive for congestion. Negative for sinus pain and sore throat.   Eyes: Negative.   Respiratory:  Positive for cough and shortness of breath. Negative for hemoptysis, sputum production and wheezing.   Cardiovascular:  Negative for chest pain, palpitations, orthopnea, claudication and leg swelling.  Gastrointestinal:  Negative for abdominal pain, heartburn, nausea and vomiting.  Genitourinary: Negative.   Musculoskeletal:  Negative for joint pain and myalgias.  Skin:  Negative for rash.  Neurological:  Negative for weakness.  Endo/Heme/Allergies: Negative.   Psychiatric/Behavioral: Negative.     Objective:   Vitals:   09/21/21 1108  BP: 124/68  Pulse: 71  SpO2: 98%  Weight: 231 lb (104.8 kg)  Height: 5\' 10"  (1.778 m)   Physical Exam Constitutional:      General: He is not in acute distress. HENT:     Head: Normocephalic and atraumatic.  Eyes:     Extraocular Movements: Extraocular movements intact.     Conjunctiva/sclera: Conjunctivae normal.     Pupils: Pupils are equal, round, and reactive to light.  Cardiovascular:     Rate and Rhythm: Normal rate and regular rhythm.     Pulses: Normal pulses.     Heart sounds: Normal heart sounds. No murmur heard. Pulmonary:     Effort: Pulmonary effort is normal.     Breath sounds: Normal breath sounds. No wheezing, rhonchi or rales.  Abdominal:     General: Bowel sounds are normal.     Palpations: Abdomen is soft.  Musculoskeletal:     Right lower leg: No edema.     Left lower leg: No edema.  Lymphadenopathy:     Cervical: No cervical adenopathy.  Skin:    General: Skin is warm and dry.  Neurological:     General: No focal deficit present.     Mental Status: He is alert.  Psychiatric:        Mood and Affect: Mood normal.        Behavior: Behavior normal.         Thought Content: Thought content normal.        Judgment: Judgment normal.   CBC    Component Value Date/Time   WBC 4.3 02/05/2011 0857   RBC 4.92 02/05/2011 0857   HGB 15.3 02/05/2011 0857   HCT 43.3 02/05/2011 0857   PLT 188 02/05/2011 0857   MCV 88.0 02/05/2011 0857  MCH 31.1 02/05/2011 0857   MCHC 35.3 02/05/2011 0857   RDW 13.2 02/05/2011 0857   BMP Latest Ref Rng & Units 08/27/2021  Glucose 70 - 99 mg/dL 92  BUN 6 - 24 mg/dL 13  Creatinine 0.76 - 1.27 mg/dL 1.09  BUN/Creat Ratio 9 - 20 12  Sodium 134 - 144 mmol/L 142  Potassium 3.5 - 5.2 mmol/L 4.4  Chloride 96 - 106 mmol/L 105  CO2 20 - 29 mmol/L 23  Calcium 8.7 - 10.2 mg/dL 9.9   Chest imaging: CT Coronary Scan 08/30/21 6 mm subpleural nodule in the right middle lobe associated with the minor fissure (axial image 13 of series 11), stable compared to prior study from 2018, considered definitively benign (presumably a subpleural lymph node). Within the visualized portions of the thorax there are no suspicious appearing pulmonary nodules or masses, there is no acute consolidative airspace disease, no pleural effusions, no pneumothorax and no lymphadenopathy. Visualized portions of the upper abdomen are unremarkable. There are no aggressive appearing lytic or blastic lesions noted in the visualized portions of the skeleton.  PFT: No flowsheet data found.  Labs:  Path:  Echo 08/13/21: LV EF is 55-60%. Normal diastolic parameters. RV systolic function is low normal. RV size is normal.   Heart Catheterization:  Assessment & Plan:   Shortness of breath - Plan: CT CHEST HIGH RESOLUTION, Pulmonary Function Test  Mild intermittent reactive airway disease without complication - Plan: fluticasone-salmeterol (ADVAIR HFA) 115-21 MCG/ACT inhaler  Discussion: Lance Walker is a 56 year old male, former smoker with GERD, seasonal allergies and OSA on CPAP who is referred to pulmonary clinic for shortness of breath.    The etiology of his dyspnea with exertion is not clear at this time. He has risk factors for obstructive lung disease given his second hand smoke exposures, brief smoking history and construction dust exposures.   We will trial him on advair HFA 115-62mcg 2 puffs twice daily and monitor for improvement.   We will check HRCT Chest and PFTs for further evaluation.  Follow up in 3 months.   Freda Jackson, MD [facility] Office: [phone]    Current Outpatient Medications:    cetirizine (ZYRTEC) 10 MG tablet, Take 10 mg daily by mouth., Disp: , Rfl:    ezetimibe (ZETIA) 10 MG tablet, TAKE 1 TABLET DAILY, Disp: 90 tablet, Rfl: 3   fluticasone-salmeterol (ADVAIR HFA) 115-21 MCG/ACT inhaler, Inhale 2 puffs into the lungs 2 (two) times daily., Disp: 1 each, Rfl: 12   rosuvastatin (CRESTOR) 10 MG tablet, TAKE 1 TABLET DAILY, Disp: 90 tablet, Rfl: 3   tamsulosin (FLOMAX) 0.4 MG CAPS capsule, Take 1 capsule by mouth daily., Disp: , Rfl:

## 2021-09-21 NOTE — Patient Instructions (Addendum)
Start advair 115-70mcg 2 puffs twice daily ?- rinse mouth out after each use ? ?We will schedule you for high resolution CT Chest scan and pulmonary function tests at earliest possible times. ? ?We will follow up with you on the results of those tests ? ?Follow up in 3 months ?

## 2021-10-01 ENCOUNTER — Other Ambulatory Visit: Payer: Self-pay | Admitting: Otolaryngology

## 2021-10-01 DIAGNOSIS — R1314 Dysphagia, pharyngoesophageal phase: Secondary | ICD-10-CM

## 2021-10-08 ENCOUNTER — Other Ambulatory Visit: Payer: Self-pay

## 2021-10-08 ENCOUNTER — Ambulatory Visit (INDEPENDENT_AMBULATORY_CARE_PROVIDER_SITE_OTHER)
Admission: RE | Admit: 2021-10-08 | Discharge: 2021-10-08 | Disposition: A | Discharge status: Home / Self Care | Payer: Commercial Managed Care - PPO | Source: Ambulatory Visit | Attending: Pulmonary Disease | Admitting: Pulmonary Disease

## 2021-10-08 DIAGNOSIS — R0602 Shortness of breath: Secondary | ICD-10-CM

## 2021-10-09 ENCOUNTER — Ambulatory Visit
Admission: RE | Admit: 2021-10-09 | Discharge: 2021-10-09 | Disposition: A | Discharge status: Home / Self Care | Payer: Commercial Managed Care - PPO | Source: Ambulatory Visit | Attending: Otolaryngology | Admitting: Otolaryngology

## 2021-10-09 DIAGNOSIS — R1314 Dysphagia, pharyngoesophageal phase: Secondary | ICD-10-CM

## 2021-10-18 ENCOUNTER — Other Ambulatory Visit: Payer: Self-pay | Admitting: Family Medicine

## 2021-10-18 DIAGNOSIS — R932 Abnormal findings on diagnostic imaging of liver and biliary tract: Secondary | ICD-10-CM

## 2021-10-29 ENCOUNTER — Ambulatory Visit (INDEPENDENT_AMBULATORY_CARE_PROVIDER_SITE_OTHER): Payer: Commercial Managed Care - PPO | Admitting: Pulmonary Disease

## 2021-10-29 DIAGNOSIS — R0602 Shortness of breath: Secondary | ICD-10-CM

## 2021-10-29 LAB — PULMONARY FUNCTION TEST
DL/VA % pred: 122 %
DL/VA: 5.32 ml/min/mmHg/L
DLCO cor % pred: 107 %
DLCO cor: 30.75 ml/min/mmHg
DLCO unc % pred: 107 %
DLCO unc: 30.75 ml/min/mmHg
FEF 25-75 Post: 3.28 L/sec
FEF 25-75 Pre: 3.23 L/sec
FEF2575-%Change-Post: 1 %
FEF2575-%Pred-Post: 100 %
FEF2575-%Pred-Pre: 98 %
FEV1-%Change-Post: 0 %
FEV1-%Pred-Post: 84 %
FEV1-%Pred-Pre: 85 %
FEV1-Post: 3.23 L
FEV1-Pre: 3.24 L
FEV1FVC-%Change-Post: 0 %
FEV1FVC-%Pred-Pre: 105 %
FEV6-%Change-Post: 0 %
FEV6-%Pred-Post: 83 %
FEV6-%Pred-Pre: 83 %
FEV6-Post: 3.96 L
FEV6-Pre: 3.98 L
FEV6FVC-%Change-Post: 0 %
FEV6FVC-%Pred-Post: 104 %
FEV6FVC-%Pred-Pre: 103 %
FVC-%Change-Post: 0 %
FVC-%Pred-Post: 80 %
FVC-%Pred-Pre: 80 %
FVC-Post: 3.96 L
FVC-Pre: 3.98 L
Post FEV1/FVC ratio: 82 %
Post FEV6/FVC ratio: 100 %
Pre FEV1/FVC ratio: 81 %
Pre FEV6/FVC Ratio: 100 %
RV % pred: 102 %
RV: 2.18 L
TLC % pred: 91 %
TLC: 6.36 L

## 2021-10-29 NOTE — Progress Notes (Signed)
Full PFT completed today ? ?

## 2021-10-30 ENCOUNTER — Ambulatory Visit
Admission: RE | Admit: 2021-10-30 | Discharge: 2021-10-30 | Disposition: A | Discharge status: Home / Self Care | Payer: Commercial Managed Care - PPO | Source: Ambulatory Visit | Attending: Family Medicine | Admitting: Family Medicine

## 2021-10-30 DIAGNOSIS — R932 Abnormal findings on diagnostic imaging of liver and biliary tract: Secondary | ICD-10-CM

## 2021-12-24 ENCOUNTER — Ambulatory Visit: Payer: Commercial Managed Care - PPO | Admitting: Pulmonary Disease

## 2021-12-26 ENCOUNTER — Ambulatory Visit (INDEPENDENT_AMBULATORY_CARE_PROVIDER_SITE_OTHER): Payer: Commercial Managed Care - PPO | Admitting: Pulmonary Disease

## 2021-12-26 ENCOUNTER — Encounter: Payer: Self-pay | Admitting: Pulmonary Disease

## 2021-12-26 VITALS — BP 116/70 | HR 55 | Ht 70.0 in | Wt 232.2 lb

## 2021-12-26 DIAGNOSIS — R0602 Shortness of breath: Secondary | ICD-10-CM | POA: Diagnosis not present

## 2021-12-26 NOTE — Patient Instructions (Addendum)
Your pulmonary function tests are within normal limits  Your CT scan does not show abnormalities of the airways or lung tissue.   I am happy that your shortness of breath is improving from your work outs at the gym. Continue with your physical activity regimen.   Recommend cutting back on alcohol intake based on the concerns with your liver disease. Continue follow up with GI.  We will check your oxygen levels while walking today.  Follow up as needed

## 2021-12-26 NOTE — Progress Notes (Signed)
Synopsis: Referred in March 2023 for shortness of breath by Shirline Frees, MD  Subjective:   PATIENT ID: Lance Walker: male DOB: 1965/11/01, MRN: [id number]  HPI  Chief Complaint  Patient presents with   Follow-up    56mof/u, SOB, stamina still low while exercising   Lance Heinlenis a 56year old male, former smoker with GERD, seasonal allergies and OSA on CPAP who returns to pulmonary clinic for shortness of breath.   He was started on advair 115-259m 2 puffs twice daily at last visit. No improvement with this so he stopped the medication.  His stamina is improving at the gym but slowly overall. Going to the gym 2x per week.   HRCT Chest did not show any significant abnormalities of the airways or lung parenchyma. He has 7x5m6module in the RML minor fissure.  PFTs 10/29/21 are within normal limits.  He was started on omeprazole by ENT for GERD. Reviewed ENT note from 09/21/21. He never took this medication.  He saw [facility] on Monday for concern of cirrhosis. He had lab evaluation and has follow up with them in the future. He is drinking 2-6 drinks about 2 days per week.   Initial OV 09/21/21 He had cardiology evaluation, note reviewed from 08/20/21, with normal echo. He had CT coronary scan on 08/30/21 with average calcium score for his age and no significant non-cardiac findings.  He had covid 11//2021 and 10/2020 with mild symptoms. He did receive bamlanivimab/etesevimab infusion 05/2020. He then had RSV in Fall 2022.   Before covid, he was on a good gym routine and weighed 210lbs. Once the gyms closed down he stopped working out. He currently weighs 231lbs. He started going to the gym 4 months ago and is working out with semi-private trainor 2-3 days per week. He feels he has plateaued and is not getting back to where he was. His strength is improving but he feels the cardio is not.  He has intermittent heartburn. He takes as needed tums. He has seasonal allergies and  feels he has throat congestion. He is following up with ENT in the near future.   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 He has a 10 pack year smoking history. He quit in 1995. He reports second hand exposure from his parents in childhood.   Past Medical History:  Diagnosis Date   Dysplastic nevus of trunk 08/2016   Mid back   GERD (gastroesophageal reflux disease)    Hemorrhoids    With related constipation   Hyperlipidemia    Hypogonadism in male    OSA on CPAP    Concerns for possible narcolepsy   Psoriasis    Followed by Dr. JonRonnald RampSeasonal allergies      Family History  Problem Relation Age of Onset   Breast cancer Mother    Alcohol abuse Father        Died - complication from alcohol use   Heart disease Maternal Grandfather    Hypertension Maternal Grandfather      Social History   Socioeconomic History   Marital status: Married    Spouse name: Not on file   Number of children: Not on file   Years of education: 14   Highest education level: Some college, no degree  Occupational History   Occupation: ManBest boyTARandallobacco Use  Smoking status: Former    Types: Cigarettes    Quit date: 1995    Years since quitting: 28.4   Smokeless tobacco: Never   Tobacco comments:    quit 1995  Substance and Sexual Activity   Alcohol use: Yes    Alcohol/week: 4.0 standard drinks    Types: 4 Cans of beer per week    Comment: 3-5 drinks of beer or liquor   Drug use: No   Sexual activity: Not on file  Other Topics Concern   Not on file  Social History Narrative   Married father of 1 daughter.  He lives with his wife and daughter.     Social Determinants of Health   Financial Resource Strain: Not on file  Food Insecurity: Not on file  Transportation Needs: Not on file  Physical Activity: Not on file  Stress: Not on file  Social Connections: Not  on file  Intimate Partner Violence: Not on file     No Known Allergies   Outpatient Medications Prior to Visit  Medication Sig Dispense Refill   cetirizine (ZYRTEC) 10 MG tablet Take 10 mg daily by mouth.     ezetimibe (ZETIA) 10 MG tablet TAKE 1 TABLET DAILY 90 tablet 3   rosuvastatin (CRESTOR) 10 MG tablet TAKE 1 TABLET DAILY 90 tablet 3   tamsulosin (FLOMAX) 0.4 MG CAPS capsule Take 1 capsule by mouth daily.     fluticasone-salmeterol (ADVAIR HFA) 115-21 MCG/ACT inhaler Inhale 2 puffs into the lungs 2 (two) times daily. 1 each 12   No facility-administered medications prior to visit.    Review of Systems  Constitutional:  Negative for chills, fever, malaise/fatigue and weight loss.  HENT:  Negative for congestion, sinus pain and sore throat.   Eyes: Negative.   Respiratory:  Positive for shortness of breath (with exertion). Negative for cough, hemoptysis, sputum production and wheezing.   Cardiovascular:  Negative for chest pain, palpitations, orthopnea, claudication and leg swelling.  Gastrointestinal:  Negative for abdominal pain, heartburn, nausea and vomiting.  Genitourinary: Negative.   Musculoskeletal:  Negative for joint pain and myalgias.  Skin:  Negative for rash.  Neurological:  Negative for weakness.  Endo/Heme/Allergies: Negative.   Psychiatric/Behavioral: Negative.     Objective:   Vitals:   12/26/21 0911  BP: 116/70  Pulse: (!) 55  SpO2: 96%  Weight: 232 lb 3.2 oz (105.3 kg)  Height: '5\' 10"'$  (1.778 m)   Physical Exam Constitutional:      General: He is not in acute distress. HENT:     Head: Normocephalic and atraumatic.  Eyes:     Conjunctiva/sclera: Conjunctivae normal.  Cardiovascular:     Rate and Rhythm: Normal rate and regular rhythm.     Pulses: Normal pulses.     Heart sounds: Normal heart sounds. No murmur heard. Pulmonary:     Effort: Pulmonary effort is normal.     Breath sounds: Normal breath sounds. No wheezing, rhonchi or rales.   Musculoskeletal:     Right lower leg: No edema.     Left lower leg: No edema.  Skin:    General: Skin is warm and dry.  Neurological:     General: No focal deficit present.     Mental Status: He is alert.  Psychiatric:        Mood and Affect: Mood normal.        Behavior: Behavior normal.        Thought Content: Thought content normal.  Judgment: Judgment normal.   CBC    Component Value Date/Time   WBC 4.3 02/05/2011 0857   RBC 4.92 02/05/2011 0857   HGB 15.3 02/05/2011 0857   HCT 43.3 02/05/2011 0857   PLT 188 02/05/2011 0857   MCV 88.0 02/05/2011 0857   MCH 31.1 02/05/2011 0857   MCHC 35.3 02/05/2011 0857   RDW 13.2 02/05/2011 0857      Latest Ref Rng & Units 08/27/2021   12:00 AM  BMP  Glucose 70 - 99 mg/dL 92    BUN 6 - 24 mg/dL 13    Creatinine 0.76 - 1.27 mg/dL 1.09    BUN/Creat Ratio 9 - 20 12    Sodium 134 - 144 mmol/L 142    Potassium 3.5 - 5.2 mmol/L 4.4    Chloride 96 - 106 mmol/L 105    CO2 20 - 29 mmol/L 23    Calcium 8.7 - 10.2 mg/dL 9.9     Chest imaging: HRCT Chest 10/08/21 1. No findings to suggest interstitial lung disease. 2. No acute findings in the thorax to account for the patient's symptoms. 3. Aortic atherosclerosis, in addition to left anterior descending coronary artery disease. Please note that although the presence of coronary artery calcium documents the presence of coronary artery disease, the severity of this disease and any potential stenosis cannot be assessed on this non-gated CT examination. Assessment for potential risk factor modification, dietary therapy or pharmacologic therapy may be warranted, if clinically indicated. 4. Small pulmonary nodule with a mean diameter of 6 mm intimately associated with the minor fissure in the right middle lobe, stable dating back to prior study from 06/19/2017, considered benign (presumably a subpleural lymph node). 5. Morphologic changes in the liver suggesting early  cirrhosis.  CT Coronary Scan 08/30/21 6 mm subpleural nodule in the right middle lobe associated with the minor fissure (axial image 13 of series 11), stable compared to prior study from 2018, considered definitively benign (presumably a subpleural lymph node). Within the visualized portions of the thorax there are no suspicious appearing pulmonary nodules or masses, there is no acute consolidative airspace disease, no pleural effusions, no pneumothorax and no lymphadenopathy. Visualized portions of the upper abdomen are unremarkable. There are no aggressive appearing lytic or blastic lesions noted in the visualized portions of the skeleton.  PFT:    Latest Ref Rng & Units 10/29/2021    9:43 AM  PFT Results  FVC-Pre L 3.98    FVC-Predicted Pre % 80    FVC-Post L 3.96    FVC-Predicted Post % 80    Pre FEV1/FVC % % 81    Post FEV1/FCV % % 82    FEV1-Pre L 3.24    FEV1-Predicted Pre % 85    FEV1-Post L 3.23    DLCO uncorrected ml/min/mmHg 30.75    DLCO UNC% % 107    DLCO corrected ml/min/mmHg 30.75    DLCO COR %Predicted % 107    DLVA Predicted % 122    TLC L 6.36    TLC % Predicted % 91    RV % Predicted % 102    PFT: 2023 within normal limits   Labs:  Path:  Echo 08/13/21: LV EF is 55-60%. Normal diastolic parameters. RV systolic function is low normal. RV size is normal.   Heart Catheterization:  Esophogram 10/09/21 Mild mucosal fold thickening in the distal thoracic esophagus, consistent with mild esophagitis.   Otherwise unremarkable exam. No evidence of hiatal hernia or esophageal stricture.  RUQ [place] 10/30/21 1. Heterogeneous increased hepatic parenchymal echogenicity. There is subtle capsular nodularity. Findings suggestive of cirrhosis. No focal hepatic lesion. 2. Normal sonographic appearance of the gallbladder. No biliary dilatation.  Assessment & Plan:   Shortness of breath  Discussion: Yaseen Gilberg is a 56 year old male, former smoker with GERD,  seasonal allergies and OSA on CPAP who returns to pulmonary clinic for shortness of breath.   His HRCT Chest and PFTs are unremarkable. No distinct pulmonary etiology to his dyspnea. It appears it is related to deconditioning as this has improved with his continued physical activity regimen.   We discussed the possibility that cirrhosis could lead to hepatopulmonary syndrome which can lead to dyspnea. No ambulatory desaturations today on simple walk.  Follow up as needed.  Freda Jackson, MD  Pulmonary & Critical Care Office: [phone]   Current Outpatient Medications:    cetirizine (ZYRTEC) 10 MG tablet, Take 10 mg daily by mouth., Disp: , Rfl:    ezetimibe (ZETIA) 10 MG tablet, TAKE 1 TABLET DAILY, Disp: 90 tablet, Rfl: 3   rosuvastatin (CRESTOR) 10 MG tablet, TAKE 1 TABLET DAILY, Disp: 90 tablet, Rfl: 3   tamsulosin (FLOMAX) 0.4 MG CAPS capsule, Take 1 capsule by mouth daily., Disp: , Rfl:

## 2022-05-02 ENCOUNTER — Ambulatory Visit: Payer: Commercial Managed Care - PPO | Attending: Cardiology | Admitting: Cardiology

## 2022-05-02 ENCOUNTER — Encounter: Payer: Self-pay | Admitting: *Deleted

## 2022-05-02 ENCOUNTER — Encounter: Payer: Self-pay | Admitting: Cardiology

## 2022-05-02 VITALS — BP 110/70 | HR 51 | Ht 70.0 in | Wt 231.2 lb

## 2022-05-02 DIAGNOSIS — E785 Hyperlipidemia, unspecified: Secondary | ICD-10-CM | POA: Diagnosis not present

## 2022-05-02 DIAGNOSIS — E669 Obesity, unspecified: Secondary | ICD-10-CM

## 2022-05-02 DIAGNOSIS — R030 Elevated blood-pressure reading, without diagnosis of hypertension: Secondary | ICD-10-CM | POA: Diagnosis not present

## 2022-05-02 DIAGNOSIS — R0602 Shortness of breath: Secondary | ICD-10-CM | POA: Diagnosis not present

## 2022-05-02 DIAGNOSIS — G4733 Obstructive sleep apnea (adult) (pediatric): Secondary | ICD-10-CM

## 2022-05-02 NOTE — Patient Instructions (Signed)
Medication Instructions:  Your physician recommends that you continue on your current medications as directed. Please refer to the Current Medication list given to you today. *If you need a refill on your cardiac medications before your next appointment, please call your pharmacy*   Lab Work: none If you have labs (blood work) drawn today and your tests are completely normal, you will receive your results only by: [place] (if you have MyChart) OR A paper copy in the mail If you have any lab test that is abnormal or we need to change your treatment, we will call you to review the results.   Testing/Procedures: none  Follow-Up: At [facility], you and your health needs are our priority.  As part of our continuing mission to provide you with exceptional heart care, we have created designated Provider Care Teams.  These Care Teams include your primary Cardiologist (physician) and Advanced Practice Providers (APPs -  Physician Assistants and Nurse Practitioners) who all work together to provide you with the care you need, when you need it.  We recommend signing up for the patient portal called "MyChart".  Sign up information is provided on this After Visit Summary.  MyChart is used to connect with patients for Virtual Visits (Telemedicine).  Patients are able to view lab/test results, encounter notes, upcoming appointments, etc.  Non-urgent messages can be sent to your provider as well.   To learn more about what you can do with MyChart, go to [email or web address].    Your next appointment:   1 year(s) You will receive a reminder call in the mail in about 10 months reminding you to call and schedule your appointment. If you don't receive this call, please contact our office.  The format for your next appointment:   In Person  Provider:   You may see Glenetta Hew, MD or one of the following Advanced Practice Providers on your designated Care Team:   Murray Hodgkins,  NP Christell Faith, PA-C Cadence Kathlen Mody, PA-C Gerrie Nordmann, NP    Other Instructions N/A  Important Information About Sugar

## 2022-05-02 NOTE — Progress Notes (Unsigned)
Primary Care Provider: Shirline Frees, [employer] Cardiologist: Glenetta Hew, MD Electrophysiologist: None  Clinic Note: Chief Complaint  Patient presents with   Other    6 month f/u no complaints today. Meds reviewed verbally with pt.    ===================================  ASSESSMENT/PLAN   Problem List Items Addressed This Visit       Cardiology Problems   Hyperlipidemia - Primary   Relevant Orders   EKG 12-Lead (Completed)    ===================================  HPI:    Lance Walker is an obese 56 y.o. male with a PMH notable for OSA on CPAP, HLD (elevated LDL and TC but normal HDL and TG) with borderline HTN who presents today for ***.  I last saw Lance Walker was September 15, 2020.  We discussed his cardiovascular risk factors.  He had been started on Zetia with a good result.  Was reluctant to start statins.  Was frustrated about not being on go get his exercise at the gym because of the [place].  Was trying to set up a gym at home, but not the same was going out to the gym at the [facility].  Getting deconditioned and therefore having some exertional dyspnea but no real cardiac symptoms.  Recent Hospitalizations: None  He was seen in November 2022 in January 2023 by Sande Rives, PA for follow-up complaints of exertional dyspnea.  BNP negative.  Echo normal.  Had noted some improvement and was started to go back to work with a Physiological scientist.  No chest pain or shortness of breath at rest.  No orthopnea PND or edema.  Occasional palpitations described as heart racing.  Can last up to 30 minutes.  But relatively rare.  History call [facility] Cath Lab  Reviewed  CV studies:    The following studies were reviewed today: (if available, images/films reviewed: From Epic Chart or Care Everywhere) ***:   Interval History:   Lance Walker   CV Review of Symptoms (Summary): {roscv:310661}  REVIEWED OF SYSTEMS   ROS Joint pains,  deconditioning, insomnia  I have reviewed and (if needed) personally updated the patient's problem list, medications, allergies, past medical and surgical history, social and family history.   PAST MEDICAL HISTORY   Past Medical History:  Diagnosis Date   Dysplastic nevus of trunk 08/2016   Mid back   GERD (gastroesophageal reflux disease)    Hemorrhoids    With related constipation   Hyperlipidemia    Hypogonadism in male    OSA on CPAP    Concerns for possible narcolepsy   Psoriasis    Followed by Dr. Ronnald Ramp   Seasonal allergies     PAST SURGICAL HISTORY   Past Surgical History:  Procedure Laterality Date   KNEE SURGERY     NASAL SINUS SURGERY     Uvulopalatopharyngoplasty   SHOULDER SURGERY Left 08/2016   TONSILLECTOMY     VASECTOMY       There is no immunization history on file for this patient.  MEDICATIONS/ALLERGIES   Current Meds  Medication Sig   cetirizine (ZYRTEC) 10 MG tablet Take 10 mg daily by mouth.   ezetimibe (ZETIA) 10 MG tablet TAKE 1 TABLET DAILY   HYDROcodone-acetaminophen (NORCO/VICODIN) 5-325 MG tablet as needed.   predniSONE (DELTASONE) 10 MG tablet as directed.   rosuvastatin (CRESTOR) 10 MG tablet TAKE 1 TABLET DAILY   tamsulosin (FLOMAX) 0.4 MG CAPS capsule Take 1 capsule by mouth daily.    No Known Allergies  SOCIAL HISTORY/FAMILY HISTORY   Reviewed in Epic:  Pertinent findings:  Social History   Tobacco Use   Smoking status: Former    Types: Cigarettes    Quit date: 1995    Years since quitting: 28.8   Smokeless tobacco: Never   Tobacco comments:    quit 1995  Substance Use Topics   Alcohol use: Yes    Alcohol/week: 4.0 standard drinks of alcohol    Types: 4 Cans of beer per week    Comment: 3-5 drinks of beer or liquor   Drug use: No   Social History   Social History Narrative   Married father of 1 daughter.  He lives with his wife and daughter.      OBJCTIVE -PE, EKG, labs   Wt Readings from Last 3  Encounters:  05/02/22 231 lb 4 oz (104.9 kg)  12/26/21 232 lb 3.2 oz (105.3 kg)  09/21/21 231 lb (104.8 kg)    Physical Exam: BP 110/70 (BP Location: Left Arm, Patient Position: Sitting, Cuff Size: Normal)   Pulse (!) 51   Ht '5\' 10"'$  (1.778 m)   Wt 231 lb 4 oz (104.9 kg)   SpO2 98%   BMI 33.18 kg/m  Physical Exam Vitals reviewed.  Constitutional:      General: He is not in acute distress.    Appearance: Normal appearance. He is obese. He is not ill-appearing, toxic-appearing or diaphoretic.  HENT:     Head: Normocephalic and atraumatic.  Neck:     Vascular: No carotid bruit or JVD.  Cardiovascular:     Rate and Rhythm: Normal rate and regular rhythm. No extrasystoles are present.    Chest Wall: PMI is not displaced.     Pulses: Normal pulses.     Heart sounds: S1 normal and S2 normal. Heart sounds are distant. No murmur heard.    No friction rub. No gallop.  Pulmonary:     Effort: Pulmonary effort is normal. No respiratory distress.     Breath sounds: Normal breath sounds. No wheezing or rales.  Chest:     Chest wall: No tenderness.  Musculoskeletal:        General: No deformity. Normal range of motion.     Cervical back: Normal range of motion and neck supple.  Skin:    General: Skin is warm and dry.  Neurological:     General: No focal deficit present.     Mental Status: He is alert and oriented to person, place, and time. Mental status is at baseline.     Motor: No weakness.     Gait: Gait normal.  Psychiatric:        Mood and Affect: Mood normal.        Behavior: Behavior normal.        Thought Content: Thought content normal.        Judgment: Judgment normal.     Adult ECG Report  Rate: *** ;  Rhythm: {rhythm:17366};   Narrative Interpretation: ***  Recent Labs:  ***  No results found for: "CHOL", "HDL", "LDLCALC", "LDLDIRECT", "TRIG", "CHOLHDL" Lab Results  Component Value Date   CREATININE 1.09 08/27/2021   BUN 13 08/27/2021   NA 142 08/27/2021   K  4.4 08/27/2021   CL 105 08/27/2021   CO2 23 08/27/2021      Latest Ref Rng & Units 02/05/2011    8:57 AM  CBC  WBC 4.0 - 10.5 K/uL 4.3   Hemoglobin 13.0 - 17.0 g/dL 15.3  Hematocrit 39.0 - 52.0 % 43.3   Platelets 150 - 400 K/uL 188     No results found for: "HGBA1C" No results found for: "TSH"  ================================================== I spent a total of ***minutes with the patient spent in direct patient consultation.  Additional time spent with chart review  / charting (studies, outside notes, etc): *** min Total Time: *** min  Current medicines are reviewed at length with the patient today.  (+/- concerns) ***  Notice: This dictation was prepared with Dragon dictation along with smart phrase technology. Any transcriptional errors that result from this process are unintentional and may not be corrected upon review.  Studies Ordered:  Orders Placed This Encounter  Procedures   EKG 12-Lead   No orders of the defined types were placed in this encounter.   Patient Instructions / Medication Changes & Studies & Tests Ordered   Patient Instructions  Medication Instructions:  Your physician recommends that you continue on your current medications as directed. Please refer to the Current Medication list given to you today. *If you need a refill on your cardiac medications before your next appointment, please call your pharmacy*   Lab Work: none If you have labs (blood work) drawn today and your tests are completely normal, you will receive your results only by: [place] (if you have MyChart) OR A paper copy in the mail If you have any lab test that is abnormal or we need to change your treatment, we will call you to review the results.   Testing/Procedures: none  Follow-Up: At [facility], you and your health needs are our priority.  As part of our continuing mission to provide you with exceptional heart care, we have created designated  Provider Care Teams.  These Care Teams include your primary Cardiologist (physician) and Advanced Practice Providers (APPs -  Physician Assistants and Nurse Practitioners) who all work together to provide you with the care you need, when you need it.  We recommend signing up for the patient portal called "MyChart".  Sign up information is provided on this After Visit Summary.  MyChart is used to connect with patients for Virtual Visits (Telemedicine).  Patients are able to view lab/test results, encounter notes, upcoming appointments, etc.  Non-urgent messages can be sent to your provider as well.   To learn more about what you can do with MyChart, go to [email or web address].    Your next appointment:   1 year(s) You will receive a reminder call in the mail in about 10 months reminding you to call and schedule your appointment. If you don't receive this call, please contact our office.  The format for your next appointment:   In Person  Provider:   You may see Glenetta Hew, MD or one of the following Advanced Practice Providers on your designated Care Team:   Murray Hodgkins, NP Christell Faith, PA-C Cadence Kathlen Mody, PA-C Gerrie Nordmann, NP    Other Instructions N/A  Important Information About Sugar            Leonie Man, MD, MS Glenetta Hew, M.D., M.S. Interventional Cardiologist  [facility]   [address] [place], [place]  [address] [phone]           Fax [phone]    Thank you for choosing [facility] in [place]!!

## 2022-05-19 ENCOUNTER — Encounter: Payer: Self-pay | Admitting: Cardiology

## 2022-05-19 DIAGNOSIS — R0602 Shortness of breath: Secondary | ICD-10-CM | POA: Insufficient documentation

## 2022-05-19 NOTE — Assessment & Plan Note (Signed)
Blood pressure looks great today needs the medication.

## 2022-05-19 NOTE — Assessment & Plan Note (Signed)
Continue CPAP.  

## 2022-05-19 NOTE — Assessment & Plan Note (Addendum)
Relatively reassuring Coronary CTA with no significant stenosis only minimal disease of the distal RCA.  Target LDL should be less than 100.  He will be due for labs and labs checked soon.    He is now on rosuvastatin 10 mg and Zetia 10 mg daily.   Will titrate up rosuvastatin if not at goal.

## 2022-05-19 NOTE — Assessment & Plan Note (Signed)
Evaluated with an echocardiogram and Coronary CTA.  Both knees are pretty normal arguing against a cardiac etiology.  Suspect that this is more related to deconditioning.

## 2022-05-19 NOTE — Assessment & Plan Note (Signed)
Unfortunately, he is now again sidelined because of his hip injury.  Hopefully can get back into his exercise routine which will help his dyspnea but also his blood pressure and lipids.

## 2022-07-05 ENCOUNTER — Other Ambulatory Visit: Payer: Self-pay | Admitting: Cardiology

## 2022-08-21 ENCOUNTER — Encounter: Payer: Self-pay | Admitting: Cardiology

## 2022-08-22 NOTE — Telephone Encounter (Signed)
Zetia does not necessarily have anything to do with the liver.  These cholesterol levels look great for the Coronary Calcium Score that was seen.  I think low levels of statins are probably okay.  As long as your liver function tests look okay that should be fine.  If we stopped anything I would hold off on the Crestor or just simply reduce the dose to 5 mg and reassess.  Glenetta Hew, MD

## 2023-06-30 ENCOUNTER — Other Ambulatory Visit: Payer: Self-pay | Admitting: Cardiology

## 2023-07-01 ENCOUNTER — Encounter: Payer: Self-pay | Admitting: Cardiology

## 2023-07-10 NOTE — Progress Notes (Signed)
Cardiology Clinic Note   Date: 07/14/2023 ID: KAHEEM VENHAUS, DOB 1965/11/23, MRN [id number]  Primary Cardiologist:  Bryan Lemma, MD  Patient Profile    Lance Walker is a 57 y.o. male who presents to the clinic today for routine follow up.     Past medical history significant for: Nonobstructive CAD/DOE.  Echo 08/13/2021: EF 55-60%. No RWMA. Normal diastolic parameters. Low normal RV dunction. Trivial MR.  Coronary CTA 08/30/2021: Minimal mixed nonobstructive CAD. Coronary calcium score of 9.16 (55th percentile).  Borderline hypertension.  Hyperlipidemia.  Lipid panel 07/10/2023: LDL 59, HDL 43, TG 107, total 122. GERD.  OSA.  On CPAP: 100% adherence.   In summary, patient was first evaluated by Dr. Herbie Baltimore in November 2018 for management of cardiovascular risk factors. He had a coronary calcium score of 0 at that time. He complained of DOE in January 2023 and was evaluated with echo which showed normal LV function as detailed above. Coronary CTA showed minimal mixed nonobstructive CAD. In follow up, he reported improvement in symptoms since working with Systems analyst. He reported post covid infection he could work out much harder. He had covid x 2 and RSV in 2022.      History of Present Illness    Lance Walker is followed by Dr. Herbie Baltimore for the above outlined history.   Patient was last seen in the office by Dr. Herbie Baltimore on 05/02/2022 for routine follow up. He was doing well from a cardiac standpoint. He had not been able to exercise for a few weeks secondary to a torn ligament in his hip. He also reported decreased alcohol intake.   Today, patient is here alone. He denies shortness of breath,  lower extremity edema, orthopnea or PND. No chest pain, pressure, or tightness. Patient reports occasional sensation of racing heart at night. He has a Polar sports band that he uses to monitor heart rate with exercise but not while sleeping. He is getting a new type of monitor  for christmas and will determine if his heart rate is actually elevated during these episodes. He states they do not happen that frequently.  He is active working with a Systems analyst 1-3 times a week. He finds his recovery is slowly improving when he exercises with dyspnea only occurring with heavier exertion and not with routine activities. He is 100% adherent with use of CPAP.      ROS: All other systems reviewed and are otherwise negative except as noted in History of Present Illness.  Studies Reviewed    EKG Interpretation Date/Time:  Monday July 14 2023 08:44:40 EST Ventricular Rate:  56 PR Interval:  196 QRS Duration:  92 QT Interval:  400 QTC Calculation: 386 R Axis:   3  Text Interpretation: Sinus bradycardia When compared with ECG of 05/02/2022 (not in Muse) No significant change was found Confirmed by Carlos Levering [phone]) on 07/14/2023 8:50:32 AM           Physical Exam    VS:  BP 112/80 (BP Location: Left Arm, Patient Position: Sitting, Cuff Size: Normal)   Pulse (!) 56   Ht 5\' 10"  (1.778 m)   Wt 219 lb 9.6 oz (99.6 kg)   SpO2 97%   BMI 31.51 kg/m  , BMI Body mass index is 31.51 kg/m.  GEN: Well nourished, well developed, in no acute distress. Neck: No JVD or carotid bruits. Cardiac:  RRR. No murmurs. No rubs or gallops.   Respiratory:  Respirations regular and unlabored.  Clear to auscultation without rales, wheezing or rhonchi. GI: Soft, nontender, nondistended. Extremities: Radials/DP/PT 2+ and equal bilaterally. No clubbing or cyanosis. No edema.  Skin: Warm and dry, no rash. Neuro: Strength intact.  Assessment & Plan   Nonobstructive CAD Coronary CTA February 2023 demonstrated minimal mixed nonobstructive CAD with calcium score of 9.16. Patient denies chest pain, pressure or tightness. He is active working with a Systems analyst 1-3 times a week.  -Continue Crestor and Zetia.   Borderline hypertension BP today 112/80. No reported  headaches or dizziness.  -Continue to monitor.   Palpitations He reports rare episodes of racing heart typically at night. He is planning on getting a new heart monitor to check his heart rate during these episodes. He is bradycardiac today.  -Instructed patient to contact office if episodes increase or become bothersome.   Hyperlipidemia LDL December 2024 59, at goal.  -Continue Crestor and Zetia.   Disposition: Return in 1 year or sooner as needed.          Signed, Etta Grandchild. Louanne Calvillo, DNP, NP-C

## 2023-07-14 ENCOUNTER — Ambulatory Visit: Payer: Commercial Managed Care - PPO | Attending: Student | Admitting: Student

## 2023-07-14 ENCOUNTER — Encounter: Payer: Self-pay | Admitting: Student

## 2023-07-14 VITALS — BP 112/80 | HR 56 | Ht 70.0 in | Wt 219.6 lb

## 2023-07-14 DIAGNOSIS — E785 Hyperlipidemia, unspecified: Secondary | ICD-10-CM

## 2023-07-14 DIAGNOSIS — R030 Elevated blood-pressure reading, without diagnosis of hypertension: Secondary | ICD-10-CM

## 2023-07-14 DIAGNOSIS — R002 Palpitations: Secondary | ICD-10-CM | POA: Diagnosis not present

## 2023-07-14 DIAGNOSIS — I2510 Atherosclerotic heart disease of native coronary artery without angina pectoris: Secondary | ICD-10-CM

## 2023-07-14 MED ORDER — EZETIMIBE 10 MG PO TABS: 10.0000 mg | ORAL_TABLET | Freq: Every day | ORAL | 3 refills | Status: DC

## 2023-07-14 MED ORDER — ROSUVASTATIN CALCIUM 10 MG PO TABS: 10.0000 mg | ORAL_TABLET | Freq: Every day | ORAL | 3 refills | Status: DC

## 2023-07-14 NOTE — Patient Instructions (Signed)
Medication Instructions:  Your physician recommends that you continue on your current medications as directed. Please refer to the Current Medication list given to you today.   *If you need a refill on your cardiac medications before your next appointment, please call your pharmacy*   Lab Work: No labs ordered today    Testing/Procedures: No test ordered today    Follow-Up: At [facility], you and your health needs are our priority.  As part of our continuing mission to provide you with exceptional heart care, we have created designated Provider Care Teams.  These Care Teams include your primary Cardiologist (physician) and Advanced Practice Providers (APPs -  Physician Assistants and Nurse Practitioners) who all work together to provide you with the care you need, when you need it.  We recommend signing up for the patient portal called "MyChart".  Sign up information is provided on this After Visit Summary.  MyChart is used to connect with patients for Virtual Visits (Telemedicine).  Patients are able to view lab/test results, encounter notes, upcoming appointments, etc.  Non-urgent messages can be sent to your provider as well.   To learn more about what you can do with MyChart, go to [email or web address].    Your next appointment:   1 year(s)  Provider:   You may see Bryan Lemma, MD or one of the following Advanced Practice Providers on your designated Care Team:   Nicolasa Ducking, NP Eula Listen, PA-C Cadence Fransico Michael, PA-C Charlsie Quest, NP Carlos Levering, NP

## 2024-06-24 ENCOUNTER — Other Ambulatory Visit: Payer: Self-pay | Admitting: Student

## 2024-06-24 NOTE — Telephone Encounter (Signed)
Please contact pt for future appointment. 

## 2024-07-09 NOTE — Progress Notes (Signed)
 "  Cardiology Clinic Note   Date: 07/14/2024 ID: Lance Walker, DOB [birth year], MRN [id number]  Primary Cardiologist:  Lance Clay, MD  Chief Complaint   Lance Walker is a 58 y.o. male who presents to the clinic today for routine follow up.   Patient Profile   Lance Walker is followed by Dr. Clay for the history outlined below.      Past medical history significant for: Nonobstructive CAD/DOE.  Echo 08/13/2021: EF 55-60%. No RWMA. Normal diastolic parameters. Low normal RV dunction. Trivial MR.  Coronary CTA 08/30/2021: Minimal mixed nonobstructive CAD. Coronary calcium  score of 9.16 (55th percentile).  Borderline hypertension.  Hyperlipidemia.  Lipid panel 07/10/2023: LDL 59, HDL 43, TG 107, total 122. GERD.  OSA.  On CPAP: 100% adherence.   In summary, patient was first evaluated by Dr. Clay in November 2018 for management of cardiovascular risk factors. He had a coronary calcium  score of 0 at that time. He complained of DOE in January 2023 and was evaluated with echo which showed normal LV function as detailed above. Coronary CTA showed minimal mixed nonobstructive CAD. In follow up, he reported improvement in symptoms since working with systems analyst. He reported post covid infection he could work out much harder. He had covid x 2 and RSV in 2022.    Patient was seen in clinic on 05/02/2022 for routine follow up. He was doing well from a cardiac standpoint. He had not been able to exercise for a few weeks secondary to a torn ligament in his hip. He also reported decreased alcohol intake.   Patient was last seen in the office by me on 07/14/2023 for routine follow-up.  He reported occasional sensation of heart racing at night.  He was utilizing a polar sports band to monitor heart rate with exercise but not while sleeping.  He plans on getting a new monitor for Christmas that he would be able to wear while sleeping.  He was active working with a systems analyst  1-3 times a week.  No changes were made.     History of Present Illness    Today, patient is doing well. Patient denies shortness of breath, dyspnea on exertion, lower extremity edema, orthopnea or PND. No chest pain, pressure, or tightness. No palpitations. He is very active working with a systems analyst a minimum of 2 days a week. 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 He is still able to follow a heart healthy diet despite the travel.     ROS: All other systems reviewed and are otherwise negative except as noted in History of Present Illness.  EKGs/Labs Reviewed    EKG Interpretation Date/Time:  Wednesday July 14 2024 07:59:46 EST Ventricular Rate:  57 PR Interval:  200 QRS Duration:  102 QT Interval:  414 QTC Calculation: 402 R Axis:   11  Text Interpretation: Sinus bradycardia When compared with ECG of 14-Jul-2023 08:44, No significant change was found Confirmed by Lance Walker [phone]) on 07/14/2024 8:04:16 AM   Physical Exam    VS:  BP 114/84 (BP Location: Left Arm, Patient Position: Sitting, Cuff Size: Normal)   Pulse (!) 57   Ht 5' 10 (1.778 m)   Wt 222 lb (100.7 kg)   SpO2 96%   BMI 31.85 kg/m  , BMI Body mass index is 31.85 kg/m.  GEN: Well nourished, well developed, in no acute distress. Neck: No JVD or carotid bruits. Cardiac:  RRR.  No murmur. No rubs or gallops.   Respiratory:  Respirations regular and unlabored. Clear to auscultation without rales, wheezing or rhonchi. GI: Soft, nontender, nondistended. Extremities: Radials/DP/PT 2+ and equal bilaterally. No clubbing or cyanosis. No edema   Skin: Warm and dry, no rash. Neuro: Strength intact.  Assessment & Plan   Nonobstructive CAD Coronary CTA February 2023 demonstrated minimal mixed nonobstructive CAD with calcium  score of 9.16. Patient denies chest pain, pressure or tightness. He is active working with a personal training 2-3 times a week. EKG without  acute changes.  - Continue Crestor  and Zetia .    Borderline hypertension BP today 114/84. No reported headaches or dizziness.  - Continue to monitor.    Hyperlipidemia LDL 59 December 2024, at goal. - Continue Crestor  and Zetia . - CBC, CMP, lipid panel today.   Disposition: Return in 1 year or sooner as needed.          Signed, Lance Walker. Lance Apollo, DNP, NP-C  "

## 2024-07-14 ENCOUNTER — Ambulatory Visit: Attending: Student | Admitting: Student

## 2024-07-14 ENCOUNTER — Encounter: Payer: Self-pay | Admitting: Student

## 2024-07-14 VITALS — BP 114/84 | HR 57 | Ht 70.0 in | Wt 222.0 lb

## 2024-07-14 DIAGNOSIS — Z79899 Other long term (current) drug therapy: Secondary | ICD-10-CM | POA: Diagnosis not present

## 2024-07-14 DIAGNOSIS — Z5181 Encounter for therapeutic drug level monitoring: Secondary | ICD-10-CM | POA: Diagnosis not present

## 2024-07-14 DIAGNOSIS — E785 Hyperlipidemia, unspecified: Secondary | ICD-10-CM | POA: Diagnosis not present

## 2024-07-14 DIAGNOSIS — I2510 Atherosclerotic heart disease of native coronary artery without angina pectoris: Secondary | ICD-10-CM | POA: Diagnosis not present

## 2024-07-14 NOTE — Patient Instructions (Signed)
 Medication Instructions:  Your physician recommends that you continue on your current medications as directed. Please refer to the Current Medication list given to you today.   *If you need a refill on your cardiac medications before your next appointment, please call your pharmacy*  Lab Work: Your provider would like for you to have following labs drawn today CBC, CMP, and lipid panel.   If you have labs (blood work) drawn today and your tests are completely normal, you will receive your results only by: MyChart Message (if you have MyChart) OR A paper copy in the mail If you have any lab test that is abnormal or we need to change your treatment, we will call you to review the results.  Follow-Up: At [facility], you and your health needs are our priority.  As part of our continuing mission to provide you with exceptional heart care, our providers are all part of one team.  This team includes your primary Cardiologist (physician) and Advanced Practice Providers or APPs (Physician Assistants and Nurse Practitioners) who all work together to provide you with the care you need, when you need it.  Your next appointment:   1 year(s)  Provider:   You may see Alm Clay, MD or Barnie Hila, NP

## 2024-07-15 LAB — COMPREHENSIVE METABOLIC PANEL WITH GFR
ALT: 29 IU/L (ref 0–44)
AST: 20 IU/L (ref 0–40)
Albumin: 4.6 g/dL (ref 3.8–4.9)
Alkaline Phosphatase: 65 IU/L (ref 47–123)
BUN/Creatinine Ratio: 15 (ref 9–20)
BUN: 19 mg/dL (ref 6–24)
Bilirubin Total: 0.6 mg/dL (ref 0.0–1.2)
CO2: 24 mmol/L (ref 20–29)
Calcium: 9.8 mg/dL (ref 8.7–10.2)
Chloride: 105 mmol/L (ref 96–106)
Creatinine, Ser: 1.24 mg/dL (ref 0.76–1.27)
Globulin, Total: 2 g/dL (ref 1.5–4.5)
Glucose: 88 mg/dL (ref 70–99)
Potassium: 4.9 mmol/L (ref 3.5–5.2)
Sodium: 143 mmol/L (ref 134–144)
Total Protein: 6.6 g/dL (ref 6.0–8.5)
eGFR: 68 mL/min/1.73

## 2024-07-15 LAB — CBC
Hematocrit: 46.1 % (ref 37.5–51.0)
Hemoglobin: 15.2 g/dL (ref 13.0–17.7)
MCH: 29.6 pg (ref 26.6–33.0)
MCHC: 33 g/dL (ref 31.5–35.7)
MCV: 90 fL (ref 79–97)
Platelets: 174 x10E3/uL (ref 150–450)
RBC: 5.13 x10E6/uL (ref 4.14–5.80)
RDW: 12.4 % (ref 11.6–15.4)
WBC: 4.8 x10E3/uL (ref 3.4–10.8)

## 2024-07-15 LAB — LIPID PANEL
Chol/HDL Ratio: 2.4 ratio (ref 0.0–5.0)
Cholesterol, Total: 95 mg/dL — ABNORMAL LOW (ref 100–199)
HDL: 39 mg/dL — ABNORMAL LOW
LDL Chol Calc (NIH): 43 mg/dL (ref 0–99)
Triglycerides: 56 mg/dL (ref 0–149)
VLDL Cholesterol Cal: 13 mg/dL (ref 5–40)

## 2024-07-17 ENCOUNTER — Ambulatory Visit: Payer: Self-pay | Admitting: Student

## 2024-07-19 NOTE — Progress Notes (Signed)
 Last read by Alm DELENA Treasa Dasie at 8:08AM on 07/19/2024.
